# Patient Record
Sex: Male | Born: 1935 | Race: White | Hispanic: No | Smoking: Never smoker
Health system: Southern US, Community
[De-identification: ages and names within clinical notes are randomized; demographics above are authoritative.]

## PROBLEM LIST (undated history)

## (undated) DIAGNOSIS — E860 Dehydration: Secondary | ICD-10-CM

## (undated) DIAGNOSIS — N400 Enlarged prostate without lower urinary tract symptoms: Secondary | ICD-10-CM

## (undated) DIAGNOSIS — K469 Unspecified abdominal hernia without obstruction or gangrene: Secondary | ICD-10-CM

## (undated) DIAGNOSIS — K219 Gastro-esophageal reflux disease without esophagitis: Secondary | ICD-10-CM

## (undated) DIAGNOSIS — I2510 Atherosclerotic heart disease of native coronary artery without angina pectoris: Secondary | ICD-10-CM

## (undated) DIAGNOSIS — G4731 Primary central sleep apnea: Secondary | ICD-10-CM

## (undated) DIAGNOSIS — I82409 Acute embolism and thrombosis of unspecified deep veins of unspecified lower extremity: Secondary | ICD-10-CM

## (undated) DIAGNOSIS — R45851 Suicidal ideations: Secondary | ICD-10-CM

## (undated) DIAGNOSIS — I2699 Other pulmonary embolism without acute cor pulmonale: Secondary | ICD-10-CM

## (undated) DIAGNOSIS — D6851 Activated protein C resistance: Secondary | ICD-10-CM

## (undated) HISTORY — DX: Activated protein C resistance: D68.51

## (undated) HISTORY — DX: Dehydration: E86.0

## (undated) HISTORY — DX: Benign prostatic hyperplasia without lower urinary tract symptoms: N40.0

## (undated) HISTORY — DX: Other pulmonary embolism without acute cor pulmonale: I26.99

## (undated) HISTORY — DX: Primary central sleep apnea: G47.31

## (undated) HISTORY — PX: HERNIA REPAIR: SHX51

## (undated) HISTORY — PX: CORONARY ARTERY BYPASS GRAFT: SHX141

## (undated) HISTORY — DX: Unspecified abdominal hernia without obstruction or gangrene: K46.9

## (undated) HISTORY — DX: Acute embolism and thrombosis of unspecified deep veins of unspecified lower extremity: I82.409

## (undated) HISTORY — PX: CATARACT EXTRACTION: SUR2

## (undated) HISTORY — DX: Suicidal ideations: R45.851

## (undated) HISTORY — DX: Atherosclerotic heart disease of native coronary artery without angina pectoris: I25.10

## (undated) HISTORY — DX: Gastro-esophageal reflux disease without esophagitis: K21.9

---

## 2004-05-28 ENCOUNTER — Other Ambulatory Visit: Payer: Self-pay

## 2005-04-17 DIAGNOSIS — I25810 Atherosclerosis of coronary artery bypass graft(s) without angina pectoris: Secondary | ICD-10-CM | POA: Insufficient documentation

## 2005-04-29 ENCOUNTER — Other Ambulatory Visit: Payer: Self-pay

## 2005-04-29 ENCOUNTER — Inpatient Hospital Stay: Payer: Medicare Other | Admitting: Cardiovascular Disease

## 2005-04-30 ENCOUNTER — Other Ambulatory Visit: Payer: Self-pay

## 2005-05-01 ENCOUNTER — Other Ambulatory Visit: Payer: Self-pay

## 2005-05-02 ENCOUNTER — Other Ambulatory Visit: Payer: Self-pay

## 2005-05-16 ENCOUNTER — Ambulatory Visit: Payer: Self-pay | Admitting: Internal Medicine

## 2005-07-04 ENCOUNTER — Encounter: Payer: Medicare Other | Admitting: Cardiovascular Disease

## 2005-07-18 ENCOUNTER — Encounter: Payer: Medicare Other | Admitting: Cardiovascular Disease

## 2005-08-18 ENCOUNTER — Encounter: Payer: Medicare Other | Admitting: Cardiovascular Disease

## 2005-09-17 ENCOUNTER — Encounter: Payer: Medicare Other | Admitting: Cardiovascular Disease

## 2007-05-01 ENCOUNTER — Ambulatory Visit: Payer: Self-pay | Admitting: Internal Medicine

## 2007-06-04 ENCOUNTER — Ambulatory Visit: Payer: Self-pay

## 2007-06-04 ENCOUNTER — Encounter: Payer: Self-pay | Admitting: Internal Medicine

## 2007-06-17 ENCOUNTER — Ambulatory Visit: Payer: Self-pay | Admitting: Internal Medicine

## 2007-08-28 ENCOUNTER — Ambulatory Visit: Payer: Self-pay | Admitting: Internal Medicine

## 2007-11-19 ENCOUNTER — Ambulatory Visit: Payer: Self-pay | Admitting: Internal Medicine

## 2007-11-19 LAB — CONVERTED CEMR LAB
AST: 24 units/L (ref 0–37)
Albumin: 4.2 g/dL (ref 3.5–5.2)
Alkaline Phosphatase: 92 units/L (ref 39–117)
Bilirubin, Direct: 0.1 mg/dL (ref 0.0–0.3)
Indirect Bilirubin: 0.5 mg/dL (ref 0.0–0.9)
LDL Cholesterol: 133 mg/dL — ABNORMAL HIGH (ref 0–99)
Total Bilirubin: 0.6 mg/dL (ref 0.3–1.2)

## 2007-11-22 ENCOUNTER — Ambulatory Visit: Payer: Self-pay | Admitting: Internal Medicine

## 2008-03-20 ENCOUNTER — Ambulatory Visit: Payer: Self-pay | Admitting: Internal Medicine

## 2008-03-26 ENCOUNTER — Ambulatory Visit: Payer: Self-pay | Admitting: Internal Medicine

## 2008-03-26 LAB — CONVERTED CEMR LAB
ALT: 22 units/L (ref 0–53)
BUN: 29 mg/dL — ABNORMAL HIGH (ref 6–23)
CO2: 25 meq/L (ref 19–32)
Calcium: 8.9 mg/dL (ref 8.4–10.5)
Chloride: 106 meq/L (ref 96–112)
Cholesterol: 153 mg/dL (ref 0–200)
Creatinine, Ser: 1.59 mg/dL — ABNORMAL HIGH (ref 0.40–1.50)
Glucose, Bld: 76 mg/dL (ref 70–99)
Total CHOL/HDL Ratio: 3.4
Triglycerides: 116 mg/dL (ref <150)

## 2008-03-31 ENCOUNTER — Encounter: Payer: Self-pay | Admitting: Internal Medicine

## 2008-03-31 LAB — CONVERTED CEMR LAB
Eosinophils Absolute: 0.5 10*3/uL (ref 0.0–0.7)
Eosinophils Relative: 8 % — ABNORMAL HIGH (ref 0–5)
HCT: 45.9 % (ref 39.0–52.0)
Lymphs Abs: 2.2 10*3/uL (ref 0.7–4.0)
MCHC: 32.7 g/dL (ref 30.0–36.0)
MCV: 96 fL (ref 78.0–100.0)
Monocytes Relative: 8 % (ref 3–12)
Platelets: 233 10*3/uL (ref 150–400)
RBC: 4.78 M/uL (ref 4.22–5.81)
WBC: 7 10*3/uL (ref 4.0–10.5)

## 2008-07-17 ENCOUNTER — Ambulatory Visit: Payer: Self-pay | Admitting: Internal Medicine

## 2008-07-27 ENCOUNTER — Ambulatory Visit: Payer: Self-pay | Admitting: Cardiology

## 2008-07-27 LAB — CONVERTED CEMR LAB
Alkaline Phosphatase: 79 units/L (ref 39–117)
BUN: 27 mg/dL — ABNORMAL HIGH (ref 6–23)
Glucose, Bld: 100 mg/dL — ABNORMAL HIGH (ref 70–99)
HDL: 50 mg/dL (ref >39)
LDL Cholesterol: 78 mg/dL (ref 0–99)
Total Bilirubin: 0.6 mg/dL (ref 0.3–1.2)
Total CHOL/HDL Ratio: 2.9
Triglycerides: 87 mg/dL (ref <150)
VLDL: 17 mg/dL (ref 0–40)

## 2008-10-14 ENCOUNTER — Ambulatory Visit: Payer: Self-pay | Admitting: Internal Medicine

## 2008-10-28 ENCOUNTER — Encounter: Payer: Self-pay | Admitting: Internal Medicine

## 2008-10-28 ENCOUNTER — Ambulatory Visit: Payer: Self-pay

## 2009-01-20 ENCOUNTER — Encounter: Payer: Self-pay | Admitting: Internal Medicine

## 2009-01-20 ENCOUNTER — Ambulatory Visit: Payer: Self-pay | Admitting: Internal Medicine

## 2009-01-20 DIAGNOSIS — E785 Hyperlipidemia, unspecified: Secondary | ICD-10-CM

## 2009-01-20 DIAGNOSIS — R0989 Other specified symptoms and signs involving the circulatory and respiratory systems: Secondary | ICD-10-CM | POA: Insufficient documentation

## 2009-01-20 DIAGNOSIS — I359 Nonrheumatic aortic valve disorder, unspecified: Secondary | ICD-10-CM | POA: Insufficient documentation

## 2009-01-21 ENCOUNTER — Ambulatory Visit: Payer: Self-pay | Admitting: Cardiology

## 2009-01-21 LAB — CONVERTED CEMR LAB
ALT: 18 units/L (ref 0–53)
Basophils Absolute: 0 10*3/uL (ref 0.0–0.1)
CO2: 24 meq/L (ref 19–32)
Chloride: 107 meq/L (ref 96–112)
Cholesterol: 153 mg/dL (ref 0–200)
Eosinophils Relative: 5 % (ref 0–5)
LDL Cholesterol: 80 mg/dL (ref 0–99)
Lymphocytes Relative: 35 % (ref 12–46)
Neutro Abs: 3.7 10*3/uL (ref 1.7–7.7)
Platelets: 225 10*3/uL (ref 150–400)
RDW: 12.5 % (ref 11.5–15.5)
Sodium: 143 meq/L (ref 135–145)
TSH: 3.941 microintl units/mL (ref 0.350–4.50)
Total Bilirubin: 0.6 mg/dL (ref 0.3–1.2)
Total Protein: 6.9 g/dL (ref 6.0–8.3)
VLDL: 29 mg/dL (ref 0–40)

## 2009-07-23 ENCOUNTER — Ambulatory Visit: Payer: Self-pay | Admitting: Internal Medicine

## 2010-02-25 ENCOUNTER — Ambulatory Visit: Payer: Self-pay | Admitting: Internal Medicine

## 2010-03-24 ENCOUNTER — Encounter: Payer: Self-pay | Admitting: Internal Medicine

## 2010-03-24 ENCOUNTER — Ambulatory Visit: Payer: Self-pay

## 2010-03-31 ENCOUNTER — Encounter: Payer: Self-pay | Admitting: Cardiology

## 2010-09-02 ENCOUNTER — Ambulatory Visit: Payer: Self-pay | Admitting: Internal Medicine

## 2011-01-17 NOTE — Medication Information (Signed)
Summary: Med List  Med List   Imported By: Harlon Flor 03/31/2010 09:17:10  _____________________________________________________________________  External Attachment:    Type:   Image     Comment:   External Document

## 2011-01-17 NOTE — Assessment & Plan Note (Signed)
Summary: F6M/AMD      Allergies Added: NKDA  Visit Type:  Follow-up Primary Provider:  Dareen [name]   History of Present Illness: Travis [name] is a delightful 77 year old male with a history of coronary artery disease status post non-ST-elevation myocardial infarction in May 2006 followed by bypass surgery.  He has a history of central sleep apnea, mild aortic insufficiency, mild mitral insufficiency, and a history of pulmonary embolus in the setting of fall in 2005.  He returns today for routine followup.   ECHO 4/11 EF 50-55% mild to moderate AI, mild MR.   Overall doing fairly well. Feels weak at times. Continues to do all his activities independently. Denies CP, sob or dizziness. No CHF symptoms. No palpitations. Compliant with medications.  Current Medications (verified): 1)  Aspirin Adult Low Strength 81 Mg Tbec (Aspirin) .Marland Kitchen.. 1 By Mouth Daily 2)  Lipitor 80 Mg Tabs (Atorvastatin Calcium) .Marland Kitchen.. 1 By Mouth Daily 3)  Prilosec 20 Mg Cpdr (Omeprazole) .Marland Kitchen.. 1 By Mouth Daily 4)  Docusate Sodium 100 Mg Caps (Docusate Sodium) .Marland Kitchen.. 1 By Mouth Bid 5)  Avodart 0.5 Mg Caps (Dutasteride) .Marland Kitchen.. 1 By Mouth T,th,sat  Allergies (verified): No Known Drug Allergies  Past History:  Past Medical History: Last updated: 07/23/2009  1. Coronary artery disease.       a.   s/p NSTEMI 04/2005.       b.   s/p CABG  with a left internal mammary artery to the left anterior        descending, a saphenous vein graft to the PDA, a saphenous vein        graft to the right coronary, and a saphenous vein graft to the 1st        obtuse marginal.       c.     Echo 11/09: EF 60% mild AI.  2. History of pulmonary embolus in the setting of a fall in 2004 or       2005, treated with 6 months of anticoagulation.   3. Benign prostatic hypertrophy.   4. Gastroesophageal reflux disease.   5. Central sleep apnea, followed by Dr. Zara Council.   6. Hypotension requiring d/c of ACE-I in 4/09  7. h/o suicidal ideation with  SSRIs  Social History: Last updated: 01/20/2009 He is widowed.  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 He denies any tobacco or alcohol.  Marital Status:  Children:  Occupation:   Review of Systems       As per HPI and past medical history; otherwise all systems negative.   Vital Signs:  Patient profile:   77 year old male Height:      70 inches Weight:      148 pounds BMI:     21.31 Pulse rate:   84 / minute BP sitting:   112 / 72  (left arm) Cuff size:   regular  Vitals Entered By: Bishop Dublin, CMA (September 02, 2010 2:02 PM)  Physical Exam  General:  elderly. hard of hearing well appearing. no resp difficulty HEENT: normal Neck: supple. no JVD. Carotids 2+ bilat; not bruits. No lymphadenopathy or thryomegaly appreciated. Cor: PMI nondisplaced. Regular rate & rhythm. No rubs, gallops, 2/6 systolic and 2/6 diastolic murmur at RSB Lungs: clear Abdomen: soft, nontender, nondistended.Peri Jefferson bowel sounds. Extremities: no cyanosis, clubbing, rash, edema Neuro: alert & orientedx3, cranial nerves grossly intact. moves all 4 extremities w/o difficulty.  affect pleasant hard of hearing    Impression & Recommendations:  Problem # 1:  AORTIC VALVE DISORDERS (ICD-424.1) Stable by ECHO . No HF. Continue surveillance.  Problem # 2:  CAD, ARTERY BYPASS GRAFT (ICD-414.04) Stable. No evidence of ischemia. Continue current regimen.

## 2011-01-17 NOTE — Assessment & Plan Note (Signed)
Summary: F6M/AMD      Allergies Added: NKDA  Visit Type:  Follow-up Primary Provider:  Dareen Piano  CC:  no complaints.  History of Present Illness: Mr. Pozzi is a delightful 77 year old male with a history of coronary artery disease status post non-ST-elevation myocardial infarction in May 2006 followed by bypass surgery.  He has a history of central sleep apnea, mild aortic insufficiency, mild mitral insufficiency, and a history of pulmonary embolus in the setting of fall in 2005.  He returns today for routine followup.   Overall doing well. Denies CP, sob or dizziness. No CHF symptoms. Has recently been mildly tachycardic with HR in 90s. CBC and thyroid panel normal. No palpitations or lightheadedness.  Current Medications (verified): 1)  Aspirin Adult Low Strength 81 Mg Tbec (Aspirin) .Marland Kitchen.. 1 By Mouth Daily 2)  Lipitor 80 Mg Tabs (Atorvastatin Calcium) .Marland Kitchen.. 1 By Mouth Daily 3)  Prilosec 20 Mg Cpdr (Omeprazole) .Marland Kitchen.. 1 By Mouth Daily 4)  Docusate Sodium 100 Mg Caps (Docusate Sodium) .Marland Kitchen.. 1 By Mouth Bid 5)  Avodart 0.5 Mg Caps (Dutasteride) .Marland Kitchen.. 1 By Mouth T,th,sat  Allergies (verified): No Known Drug Allergies  Past History:  Past Medical History: Reviewed history from 07/23/2009 and no changes required.  1. Coronary artery disease.       a.   s/p NSTEMI 04/2005.       b.   s/p CABG  with a left internal mammary artery to the left anterior        descending, a saphenous vein graft to the PDA, a saphenous vein        graft to the right coronary, and a saphenous vein graft to the 1st        obtuse marginal.       c.     Echo 11/09: EF 60% mild AI.  2. History of pulmonary embolus in the setting of a fall in 2004 or       2005, treated with 6 months of anticoagulation.   3. Benign prostatic hypertrophy.   4. Gastroesophageal reflux disease.   5. Central sleep apnea, followed by Dr. Zara Council.   6. Hypotension requiring d/c of ACE-I in 4/09  7. h/o suicidal ideation with  SSRIs  Review of Systems       As per HPI and past medical history; otherwise all systems negative.   Vital Signs:  Patient profile:   77 year old male Height:      70 inches Weight:      154.25 pounds Pulse rate:   98 / minute Pulse rhythm:   regular BP sitting:   114 / 70  (left arm) Cuff size:   regular  Vitals Entered By: Mercer Pod (February 25, 2010 3:57 PM)  Physical Exam  General:  Gen: elderly. hard of hearing well appearing. no resp difficulty HEENT: normal Neck: supple. no JVD. Carotids 2+ bilat; not bruits. No lymphadenopathy or thryomegaly appreciated. Cor: PMI nondisplaced. Regular rate & rhythm. No rubs, gallops, 2/6 systolic and 2/6 diastolic murmur at RSB Lungs: clear Abdomen: soft, nontender, nondistended.Peri Jefferson bowel sounds. Extremities: no cyanosis, clubbing, rash, edema Neuro: alert & orientedx3, cranial nerves grossly intact. moves all 4 extremities w/o difficulty. affect pleasant hard of hearing    Impression & Recommendations:  Problem # 1:  CAD, ARTERY BYPASS GRAFT (ICD-414.04) Stable. No evidence of ischemia. Continue current regimen.  Problem # 2:  TACHYCARDIA (ICD-785) W/u to date has been normal. Will repeat echo to make sure EF  stable and evaluate aortic valve disease.  Problem # 3:  AORTIC VALVE DISORDERS (ICD-424.1) As above, will repeat echo.  Other Orders: Echocardiogram (Echo)  Patient Instructions: 1)  Your physician recommends that you schedule a follow-up appointment in: 6 months 2)  Your physician recommends that you continue on your current medications as directed. Please refer to the Current Medication list given to you today. 3)  Your physician has requested that you have an echocardiogram.  Echocardiography is a painless test that uses sound waves to create images of your heart. It provides your doctor with information about the size and shape of your heart and how well your heart's chambers and valves are working.  This  procedure takes approximately one hour. There are no restrictions for this procedure.

## 2011-03-22 ENCOUNTER — Ambulatory Visit: Payer: Medicare Other | Admitting: Ophthalmology

## 2011-04-04 ENCOUNTER — Ambulatory Visit: Payer: Medicare Other | Admitting: Ophthalmology

## 2011-05-02 NOTE — Assessment & Plan Note (Signed)
[facility] OFFICE NOTE   NAME:Travis [name], Travis [name]                          MRN:          [id number]  DATE:03/20/2008                            DOB:          12-Mar-1916    PRIMARY CARE PHYSICIAN:  Dr. Einar Crow.   INTERVAL HISTORY:  Mr. Semper is a delightful 77 year old male with a  history of coronary artery disease, status post non-ST elevation  myocardial infarction in May of 2006, followed by bypass surgery.  He  also has a history of central sleep apnea, mild aortic insufficiency,  mild mitral insufficiency, and a history of pulmonary embolus in the  setting of a fall in 2005.  He returns today for routine followup.  At  his last visit, his blood pressure was a bit low and we cut his  enalapril back.  We also have recently switched him back from lovastatin  to Lipitor as his LDL was not well controlled.   He is here today with his daughter.  Overall he is doing pretty good.  He says he has good days and bad days.  He gets short of breath if he  really exerts himself, but otherwise he does fine.  No chest pain, no  heart failure.  He does have quite a bit of fatigue.   CURRENT MEDICATIONS:  Lipitor 40 a day, enalapril 1.25 a day, aspirin  81, Avodart 0.5, Prilosec 20 a day, Colace 100 b.i.d.   PHYSICAL EXAM:  He is an elderly male in no acute distress.  Ambulates  around the clinic without any respiratory difficulty.  He is somewhat  hard of hearing.  Blood pressure is 82/56 in the left arm and 78/52 in the right arm.  HEENT:  Normal.  NECK:  Supple.  No JVD.  Carotids are 2+ bilaterally without bruits.  There is no lymphadenopathy or thyromegaly.  CARDIAC:  PMI is laterally displaced.  He is regular and mildly  tachycardic.  A very soft apical murmur.  LUNGS:  Clear.  ABDOMEN:  Soft, nontender, nondistended.  There is no  hepatosplenomegaly.  No bruits, no masses.  Good bowel sounds.  EXTREMITIES:   Warm, no cyanosis, clubbing or edema.  No rash.  NEURO:  Alert and oriented x3.  Cranial nerves II-XII are intact.  Moves  all 4 extremities without difficulty.  Affect is pleasant.   EKG shows sinus tachycardia with a rate of 100, with a right bundle  branch block, nonspecific inferior T-wave abnormality.   ASSESSMENT/PLAN:  1. Coronary artery disease.  Mr. Azam is stable.  No evidence of      ischemia.  Continue current medication.  2. Hyperlipidemia.  He is due for repeat lipids.  Goal LDL is less      than 70.  3. Hypotension.  We will stop his enalapril altogether to see how this      does.  4. Aortic insufficiency and this is mild.  It does not appear to be      symptomatic.  Echocardiogram every other year or so.  5. Tachycardia.  His heart rate not much changed from baseline but      when we see him to check his cholesterol next week, we will also      get a CBC to make sure he is not anemic, as well as a thyroid      panel.     Bevelyn Buckles. Bensimhon, MD  Electronically Signed    DRB/MedQ  DD: 03/20/2008  DT: 03/20/2008  Job #: [id number]   cc:   Einar Crow, MD

## 2011-05-02 NOTE — Assessment & Plan Note (Signed)
[facility] OFFICE NOTE   NAME:FITCHCruze, Zingaro                          MRN:          [id number]  DATE:07/17/2008                            DOB:          11-04-1916    PRIMARY CARE PHYSICIAN:  Dr. Einar Crow.   INTERVAL HISTORY:  Mr. Grayer is a delightful 77 year old male with a  history of coronary artery disease status post non-ST-elevation  myocardial infarction in May 2006 followed by bypass surgery.  He also  has a history of central sleep apnea, mild aortic insufficiency, mild  mitral insufficiency, and a history of pulmonary embolus in the setting  of a fall in 2005.  He returns today for a routine followup.   At his last visit, his blood pressure was low with systolics in the 70-  80 range.  We discontinued his enalapril, he feels much better.  We also  recently increased his Lipitor, his LDL was not at goal.   Overall, he says he is doing pretty good.  He does have good days and  bad days.  He has just mild occasional chest pain without any real  change, this is quite mild.  He has not had any severe episodes.  He has  chronic shortness of breath, which is unchanged.   CURRENT MEDICATIONS:  1. Aspirin 81 a day.  2. Omeprazole 20 a day.  3. Colace 100 b.i.d.  4. Lipitor 80 a day.  5. Avodart 0.5 mg Tuesday, Thursday, and Saturday.   PHYSICAL EXAMINATION:  GENERAL:  He is an elderly male in no acute  distress.  Ambulates around the clinic without any respiratory  difficulty.  He is hard of hearing and wears hearing aids.  VITAL SIGNS:  Blood pressure is 104/65, heart rate is 94, and weight is  151.  HEENT:  Normal.  NECK:  Supple.  No JVD.  Carotids are 2+ bilaterally without bruits.  There is no lymphadenopathy or thyromegaly.  CARDIAC:  PMI is regular.  He has regular rate and rhythm.  No obvious  murmurs.  LUNGS:  Clear.  ABDOMEN:  Soft, nontender, and nondistended.  No  hepatosplenomegaly.  No  bruits.  No masses.  Good bowel sounds.  EXTREMITIES:  Warm with no cyanosis, clubbing, or edema.  No rash.  NEURO:  Alert and oriented x3.  Cranial nerves II through XII are  intact.  Moves all 4 extremities without difficulty.  Affect is  pleasant.   EKG shows sinus rhythm with a right bundle-branch block at 94 beats per  minute.  No significant ST-T wave abnormalities.   ASSESSMENT AND PLAN:  1. Coronary artery disease.  Mr. Cybulski is stable.  He does have      occasional transient chest pain.  Continue current therapy.  2. Hyperlipidemia.  We will recheck his lipids and liver panel in 1-2      weeks.  Goal LDL is less than 70.  3. Hypotension, much improved after discontinuing his enalapril.  4. Aortic and mitral insufficiency, this is mild.  We will get an  echocardiogram in a year or so.     Bevelyn Buckles. Bensimhon, MD  Electronically Signed    DRB/MedQ  DD: 07/17/2008  DT: 07/18/2008  Job #: [id number]

## 2011-05-02 NOTE — Assessment & Plan Note (Signed)
[facility] OFFICE NOTE   NAME:FITCHDemitri, Kucinski                          MRN:          [id number]  DATE:05/01/2007                            DOB:          [birth year]    PRIMARY CARE PHYSICIAN:  Dr. Einar Crow at the [facility].   REASON FOR CONSULTATION:  Establish cardiac care in [place].   HISTORY OF PRESENT ILLNESS:  Mr. Vest is a delightful, 77 year old male  with a history of coronary artery disease.  He presented to [facility] in May of 2006 with chest pain and not feeling right.  He was found to have a small non-ST elevation myocardial infarction.  He  was subsequently transferred to [facility] and underwent 4-vessel bypass in  May of 2006 with a LIMA to the LAD, a saphenous vein graft to the PDA, a  saphenous vein graft to the right coronary, and a saphenous vein graft  to the obtuse marginal.  Initial EF was about 45%, but on followup his  EF had normalized.  His echocardiogram is also notable for some mild  aortic insufficiency, mild mitral insufficiency, and mild pulmonic  insufficiency.  He also has a history of pulmonary embolus in the  setting of a fall back in 2004 or 2005 for which he was treated with 6  months of anticoagulation.  Aside from that, his medical history is  quite benign.   Since his heart attack, he is followed closely with Dr. Nolon Rod at  [facility].  In talking to Mr. Steeves over the past month, he has noted that he  feels quite fatigued when he gets out of bed in the morning, and often  does not have the energy to do anything, and he said this has been  associated with some more shortness of breath, but he denies any chest  pain.  In looking back through his previous clinic notes with Dr. Regino Schultze,  it seems like this has been, actually, a more insidious process going on  for over a year.  He has had quite an extensive workup.  Apparently, he  has had a repeat  stress test which was negative.  He also was referred  to Dr. Lenard Simmer in the geriatrics clinic at [facility], had a full battery of  labs, including a normal thyroid panel, normal cortisol, normal  hemoglobin.  He was also sent to Dr. Beverely Risen in neurology at [facility], and  was diagnosed with central sleep apnea, and has apparently been due to  start on a new machine for central sleep apnea.  Unfortunately, it seems  as though his fatigue and morning dyspnea has been persistent.  He  denies any lower extremity edema, no orthopnea, no PND.  He has not had  fevers or chills with this.   After his wife died about 10 years ago, he did suffer from some  significant depression, and was started on Zoloft which created suicidal  ideation, and he has not been back on any antidepressants since.  He  does not feel clinically depressed.  His beta blocker was recently  decreased from twice a day to once a day, and this does not seem to have  helped him very much.   REVIEW OF SYSTEMS:  As per HPI and problem list, otherwise all systems  negative.   PROBLEM LIST:  1. Coronary artery disease.      a.     Status post non-ST elevation myocardial infarction in May of       2006.      b.     Status post coronary artery bypass grafting by Dr. Georgia Dom       at [facility] with a left internal mammary artery to the left anterior       descending, a saphenous vein graft to the PDA, a saphenous vein       graft to the right coronary, and a saphenous vein graft to the 1st       obtuse marginal.      c.     Echocardiogram showed an ejection fraction of 50% with mild       aortic insufficiency, mild mitral insufficiency, and mild pulmonic       insufficiency.  There was grade I diastolic dysfunction.  2. History of pulmonary embolus in the setting of a fall in 2004 or      2005, treated with 6 months of anticoagulation.  3. Benign prostatic hypertrophy.  4. Gastroesophageal reflux disease.  5. Central sleep apnea, followed  by Dr. Zara Council.   CURRENT MEDICATIONS:  1. Colace 100 a day.  2. Lopresor 25 once a day.  3. Prilosec 20 a day.  4. Enalapril 2.5 a day.  5. Lovastatin 40 a day.  6. Requip 0.25 mg at night.  7. Aspirin 325 a day.  8. Flomax 0.4 a day.   ALLERGIES/INTOLERANCES:  ZOLOFT WHICH CAUSED SUICIDAL IDEATION.   SOCIAL HISTORY:  He is widowed.  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  He denies any tobacco or alcohol.   FAMILY HISTORY:  Noncontributory.   PHYSICAL EXAMINATION:  He is an elderly male who appears younger than  his stated age.  He is somewhat hard of hearing.  He ambulates around  the clinic without any respiratory difficulty.  Blood pressure is 104/60 in the right arm taken manually, and in the  left arm difficult to auscultate clearly.  Heart rate is 77, weight is  160.  HEENT:  Normal except for the presence of hearing aids.  NECK:  Supple, no JVD.  Carotids are 2+ bilaterally without any bruits.  There is no lymphadenopathy or thyromegaly.  CARDIAC:  His PMI is normal.  He has a regular rate and rhythm with  normal S1 and S2.  There is an S4 present.  He has a 2/6 holosystolic  murmur at the left sternal border.  I do not hear any mitral  insufficiency.  LUNGS: Clear.  ABDOMEN:  Soft, nontender, nondistended, no hepatosplenomegaly, no  bruits, no masses, good bowel sounds.  EXTREMITIES:  Warm with no cyanosis, clubbing, or edema.  No rash.  Good  distal pulses.  NEURO:  He is alert and oriented x3.  Cranial nerves II-XII are grossly  intact.  Moves all 4 extremities without difficulty.  Affect is very  pleasant.   EKG shows normal sinus rhythm with left atrial enlargement at a rate of  77.  There is also a right bundle  branch block.  No significant ST T-  wave abnormalities.   ASSESSMENT AND PLAN: 1. Coronary artery disease status post previous myocardial infarction.      He is doing quite well  with no obvious signs of ischemia.  Continue      current regimen except for the fact that we will transition his      once a day Lopressor over to Toprol XL 25 mg a day.  2. Hyperlipidemia.  Continue statin.  We will recheck his lipids and      make sure his LDL is at goal.  3. Valvular disease.  Will get a repeat echo and continue to follow      him with yearly echoes.  Clinically, his valvular disease does not      appear to have progressed.  4. Fatigue.  I suspect this is multifactorial, but there may be a      significant component of deconditioning and perhaps elderly      depression.  I have asked him to follow up with Dr. Lenard Simmer      regarding a possible antidepressant.  We have also referred him      back to [facility] to participate in the maintenance program.  We      will check a routine battery of labs to make sure that he is not      anemic and his thyroid function is normal.   I had a long talk with both him and his daughter about these issues.  We  spent nearly an hour going through everything.  I will see him back in 6-  8 weeks to follow up on his condition and his tests.  I have also  suggested baseline Myoview to make sure that there is no subtle ischemia  contributing to his dyspnea on exertion and fatigue.     Bevelyn Buckles. Bensimhon, MD     DRB/MedQ  DD: 05/01/2007  DT: 05/01/2007  Job #: [id number]

## 2011-05-02 NOTE — Assessment & Plan Note (Signed)
[facility] OFFICE NOTE   NAME:Travis [name], Travis [name]                          MRN:          [id number]  DATE:10/14/2008                            DOB:          December 04, 1916    PRIMARY CARE PHYSICIAN:  Dr. Einar Crow   HISTORY:  Travis [name] is a delightful 77 year old male with a history of  coronary artery disease status post non-ST-elevation myocardial  infarction in May 2006 followed by bypass surgery.  He has a history of  central sleep apnea, mild aortic insufficiency, mild mitral  insufficiency, and a history of pulmonary embolus in the setting of fall  in 2005.  He returns today for routine followup.   We previously had to discontinue his enalapril secondary to significant  hypotension with blood pressures in the 70s.  He feels much better.  Otherwise, he is doing pretty well.  He denies any chest pain.  He does  get exertional dyspnea, which is chronic.  No orthopnea.  No PND.  No  lower extremity edema.   CURRENT MEDICATIONS:  1. Aspirin 81 a day.  2. Prilosec 20 a day.  3. Colace 100 b.i.d.  4. Avodart 0.5 on Tuesday, Thursday and Saturday.  5. Lipitor 80 mg a day.   PHYSICAL EXAMINATION:  GENERAL:  He is in no acute distress, ambulatory  around the clinic without any respiratory difficulty.  VITAL SIGNS:  Blood pressure is 108/58, heart rate is 89, weight is 155.  HEENT:  Normal.  NECK:  Supple.  No JVD.  Carotids are 2+ bilaterally without bruits.  There is no lymphadenopathy or thyromegaly.  CARDIAC:  PMI is normal.  He is regular rate and rhythm with a soft  diastolic murmur at the right sternal border.  No rub or gallop.  LUNGS:  Clear.  ABDOMEN:  Soft, nontender, nondistended.  No hepatosplenomegaly.  No  bruits.  No masses.  Good bowel sounds.  EXTREMITIES:  Warm with no cyanosis, clubbing, or edema.  NEURO:  Alert and oriented x3.  Cranial nerves II through XII are intact  except for poor  hearing.  Moves all 4 extremities without difficulty.  Affect is pleasant.   EKG shows sinus rhythm with a right bundle-branch block at a rate 89.  No significant ST-T wave abnormalities.   ASSESSMENT AND PLAN:  1. Coronary artery disease.  He is doing well.  No evidence of      ischemia.  Continue current therapy.  He is unable to tolerate beta-      blockers due to hypotension.  2. Aortic insufficiency, this is mild.  We will check a followup      echocardiogram in the next few      months.  3. Hyperlipidemia.  Goal LDL less than 70.  We will check his lipids      in the next visit.     Travis [name]. Bensimhon, MD  Electronically Signed    DRB/MedQ  DD: 10/14/2008  DT: 10/15/2008  Job #: [id number]

## 2011-05-02 NOTE — Assessment & Plan Note (Signed)
[facility] OFFICE NOTE   NAME:Travis [name], Travis [name]                          MRN:          [id number]  DATE:11/22/2007                            DOB:          August 19, 1916    PRIMARY CARE PHYSICIAN:  Dr. Einar Crow.   INTERVAL HISTORY:  Travis [name] is a delightful 77 year old male with a  history of coronary artery disease status post non-ST elevation  myocardial infarction in May 2006, followed by bypass surgery.  He also  has a history of central sleep apnea, mild aortic insufficiency, mild  mitral insufficiency, and history of pulmonary embolus in the setting of  fall in 2005.  He returns today for routine followup.   At his last visit, we ended up stopping his beta-blocker despite his  history of MI due to severe fatigue.  We also switched his Lipitor over  to lovastatin due to the possibility of a drug rash.  He returns today  with his daughter for routine followup.  He says his energy level is  much better.  He is a little bit tired in the morning but he has been  out raking leaves and doing all kinds of work around the house and this  fatigue is obviously better.  He denies any chest pain or shortness of  breath.  No lower extremity edema.  His main complaint is that he is  continuing to have itching and he would like to get off as many  medications as possible.   CURRENT MEDICATIONS:  1. Colace 100 a day.  2. Prilosec 20 a day.  3. Requip.  4. Enalapril 2.5 mg a day.  5. Lovastatin 40 a day,.  6. Avodart 0.5 a day.  7. Aspirin 81 mg a day.   PHYSICAL EXAMINATION:  GENERAL:  He is an elderly male in no acute  distress.  He ambulates around the clinic without any respiratory  difficulty.  He is somewhat hard of hearing.  VITAL SIGNS:  Blood pressure is 82/58 in the left arm and 90/56 in the  right arm.  HEENT:  Normal.  NECK:  Supple.  There is no JVD.  Carotids are 2 plus bilaterally  without any  bruits.  There is no lymphadenopathy or thyromegaly.  The  rash around his neck has gotten much better since I had seen it before.  There may be still minimal erythema.  CARDIAC:  PMI is laterally displaced.  Irregular rate and rhythm with a  prominent 2/6 diastolic murmur at the right sternal border and a soft  mitral regurgitation murmur at the apex.  There is no gallop.  LUNGS:  Clear.  ABDOMEN:  Soft, nontender, nondistended.  There is no  hepatosplenomegaly.  No bruits.  No masses.  Good bowel sounds.  EXTREMITIES:  Warm with no cyanosis, clubbing, or edema.  No rash.  NEUROLOGIC:  He is alert and oriented x3.  Cranial nerves II-XII are  intact except for his hearing.  He moves all 4 extremities without  difficulty.  Affect is normal.   EKG shows  a normal sinus rhythm with a right bundle branch block and  nonspecific ST-T wave abnormalities.   ASSESSMENT/PLAN:  1. Coronary artery disease, status post bypass surgery.  He is doing      quite well with no evidence of ischemia.  Unfortunately, he is      unable to tolerate beta-blockers due to severe fatigue.  2. Hyperlipidemia.  Most recent cholesterol level was LDL 133 and HDL      50.  We will switch him back from lovastatin to Lipitor 40 and      probably need to go up to 80 as his goal LDL is less than 70.  3. Blood pressure.  Blood pressure is quite low today.  We will      decrease his enalapril back down to 1.25 a day where he was before.  4. Aortic insufficiency.  This is mild.  We will check an      echocardiogram at his next visit.  There is no evidence of heart      failure.     Bevelyn Buckles. Bensimhon, MD  Electronically Signed    DRB/MedQ  DD: 11/22/2007  DT: 11/22/2007  Job #: [id number]

## 2011-05-02 NOTE — Assessment & Plan Note (Signed)
[facility] OFFICE NOTE   NAME:FITCHAntonyo, Travis [name]                          MRN:          [id number]  DATE:08/22/2007                            DOB:          1916-03-05    PRIMARY CARE PHYSICIAN:  Einar Crow, M.D.   INTERVAL HISTORY:  Travis [name] is a delightful 77 year old male with a  history of coronary artery disease status post non-ST-elevation  myocardial infarction in May of 2006 followed by bypass surgery.  He  also has a history of central sleep apnea, mild aortic insufficiency,  mild mitral insufficiency, and history of pulmonary embolism in the  setting of a fall in 2005.  He returns today for routine followup.   Overall, he is feeling quite well.  He still has problems with fatigue  and does not have as much energy as he would like to.  He gets up in the  morning and feels run down, but then he picks up as he goes through the  day and does manage to ride his exercise bike some.  He denies any chest  pain or shortness of breath.  No lower extremity edema, orthopnea, or  PND.   At his last visit, we switched him over from lovastatin to Lipitor to  lower his LDL down to 70.  He has recently developed a maculopapular  rash around his neck.  He has seen a dermatologist, who did not think it  was a drug rash, but was unsure.   CURRENT MEDICATIONS:  1. Colace 100 a day.  2. Omeprazole 20 mg a day.  3. Requip 0.25 mg at night.  4. Aspirin 325.  5. Flomax 0.4 a day.  6. Toprol XL 25.  7. Enalapril 1.25 mg a day.  8. Lipitor 40 a day.   PHYSICAL EXAM:  He is an elderly male who looks younger than his stated  age.  He is very hard of hearing.  He ambulates around the clinic slowly  without any respiratory difficulty.  Blood pressure is 108/66, heart rate 74, weight 156.  HEENT:  Normal.  NECK:  Supple.  No JVD.  Carotids are 2+ bilaterally without any bruits.  There is no lymphadenopathy or  thyromegaly.  He has a maculopapular rash  around his neck.  It is limited to this area.  CARDIAC:  PMI is laterally displaced.  He has a regular rate and rhythm  with a prominent 2/6 diastolic murmur at the right sternal border and a  soft mitral regurgitation murmur at the apex.  LUNGS:  Clear.  ABDOMEN:  Soft, nontender, and nondistended.  There is no  hepatosplenomegaly.  No bruits.  No masses.  Good bowel sounds.  EXTREMITIES:  Warm with no cyanosis, clubbing, or edema.  There is no  rash.  NEURO:  He is alert and oriented x3.  Cranial nerves 2-12 are intact.  Moves all 4 extremities without difficulty.  Affect is flattened.   ASSESSMENT AND PLAN:  1. Coronary artery disease.  This is stable without any evidence of  ongoing ischemia.  Recent Myoview looks good.  2. Aortic insufficiency.  This is stable and mild.  3. Hyperlipidemia.  There is a question of whether or not Lipitor is      causing his rash.  We will stop this and switch him back to      lovastatin at a higher dose at 40 mg a day and recheck his lipids      when he comes back.  4. Fatigue.  This is ongoing.  I suspect it is multifactorial.  We      will go ahead and stop his beta blocker despite his history of      myocardial infarction and see if this makes him symptomatically      improved.  We did suggest cardiac rehab in the past, but I think      this was cost prohibitive for him.  Overall, I think he is doing      okay.   DISPOSITION:  We will see him back in 3 months.     Bevelyn Buckles. Bensimhon, MD  Electronically Signed    DRB/MedQ  DD: 08/22/2007  DT: 08/22/2007  Job #: [id number]   cc:   Cathrine Muster, M.D.

## 2011-05-02 NOTE — Assessment & Plan Note (Signed)
[name] OFFICE NOTE   NAME:FITCHNiccolo, Travis [name]                          MRN:          [id number]  DATE:06/17/2007                            DOB:          10-21-1916    PRIMARY CARE PHYSICIAN:  Dr. Einar Crow.   INTERVAL HISTORY:  Travis Travis [name] is a delightful 77 year old male with a  history of coronary artery disease, status post non-ST elevation  myocardial infarction May 2006 followed by bypass surgery.  He also has  a history of central sleep apnea, mild aortic insufficiency, mild mitral  insufficiency and a history of pulmonary embolism in the setting of fall  in 2005, treated with 6 months of anticoagulation.  He returns today for  routine followup.   Since we last saw him he had an Myoview  which showed an ejection  fraction of  66% with no ischemia.  He continues to complain of severe  fatigue and finds it hard to do many of his activities of daily living.  Denies any chest pain or shortness of breath.  On some days, however, he  says he feels somewhat better than others and on those days he tries to  ride his stationary bike.  He has not had lower extremity edema,  orthopnea or PND.   MEDICATIONS:  1. Colace 100 mg daily.  2. Prilosec 20 daily.  3. Requip 0.25 mg at night.  4. Aspirin 325 daily.  5. Flomax 0.4 daily.  6. Toprol XL 25 daily.  7. Lovastatin 20 daily.  8. Enalapril 2.5 mg 1/2 tablet daily.   ALLERGIES:  To ZOLOFT which causes suicidal tendencies.   ACCESSORY DATA:  BNP was 79, free T4 was normal at 6.5, total  cholesterol was 193, triglycerides 149, HDL 45 and LDL is 118.  Creatinine is 1.5 which is stable.  Liver function tests are normal.  Hemoglobin is 15.5.   PHYSICAL EXAMINATION:  He is an elderly male who looks younger than his  stated age.  Ambulates around the clinic slowly without any respiratory  difficulty.  Blood pressure is 96/60.  Heart rate is 88, weight is 158,  which is down  2 pounds.  HEENT:  Normal.  NECK:  Supple.  There is no JVD.  Carotids are 2+ bilaterally without  any bruits.  There is no lymphadenopathy or thyromegaly.  CARDIAC:  The PMI is mildly laterally displaced.  He has a regular rate  and rhythm.  He has a prominent 2/6 diastolic murmur at the right upper  sternal border and a soft mitral regurgitation murmur at the apex.  LUNGS:  Clear.  ABDOMEN:  Soft, nontender, nondistended.  There is no  hepatosplenomegaly, no bruits, no masses.  Good bowel sounds.  EXTREMITIES:  Warm with no cyanosis, clubbing or edema.  He does have  several ecchymoses from where he bumped himself.  NEURO:  Alert and oriented x3.  Cranial nerves II through XII are  intact.  He moves all 4 extremities without difficulty.  He is hard of  hearing.  Affect is flattened.  ASSESSMENT AND PLAN:  1. Fatigue.  I suspect this is multifactorial but there may be a      component of significant deconditioning.  I recommended Heart      Track, though I think the cost of this may be prohibited.  I have      encouraged him to use his exercise bike more at home whenever      possible.  2. Coronary artery disease.  This is stable without any evidence of      ongoing ischemia.  Myoview looks reassuring.  3. Valvular disease.  This is stable and mild.  4. Hyperlipidemia.  LDL is not at goal.  We will attempt to switch him      over to Lipitor 40 if he can afford the cost and recheck his lipids      in 3 months.     Travis Buckles. Bensimhon, MD  Electronically Signed    DRB/MedQ  DD: 06/17/2007  DT: 06/17/2007  Job #: [id number]   cc:   Einar Crow, MD

## 2011-05-31 ENCOUNTER — Encounter: Payer: Self-pay | Admitting: Internal Medicine

## 2011-06-02 ENCOUNTER — Encounter: Payer: Self-pay | Admitting: Internal Medicine

## 2011-06-06 ENCOUNTER — Ambulatory Visit (INDEPENDENT_AMBULATORY_CARE_PROVIDER_SITE_OTHER): Payer: Self-pay | Admitting: Internal Medicine

## 2011-06-06 ENCOUNTER — Encounter: Payer: Self-pay | Admitting: Internal Medicine

## 2011-06-06 VITALS — BP 122/62 | HR 92 | Ht 70.0 in | Wt 149.1 lb

## 2011-06-06 DIAGNOSIS — I359 Nonrheumatic aortic valve disorder, unspecified: Secondary | ICD-10-CM

## 2011-06-06 DIAGNOSIS — I2510 Atherosclerotic heart disease of native coronary artery without angina pectoris: Secondary | ICD-10-CM

## 2011-06-06 DIAGNOSIS — E785 Hyperlipidemia, unspecified: Secondary | ICD-10-CM

## 2011-06-06 DIAGNOSIS — R230 Cyanosis: Secondary | ICD-10-CM | POA: Insufficient documentation

## 2011-06-06 NOTE — Assessment & Plan Note (Signed)
Suspect due to severe venous insufficiency. Arterial blood flow appears fine. Will check ABIs to confirm. Keep feet elevated. Start with compression stockings. If not improving can refer to vein clinic.

## 2011-06-06 NOTE — Progress Notes (Signed)
HPI:  Travis [name] is a delightful 77 year old male with a history of coronary artery disease status post non-ST-elevation myocardial infarction in May 2006 followed by bypass surgery.  He has a history of central sleep apnea, mild aortic insufficiency, mild mitral insufficiency, and a history of pulmonary embolus in the setting of fall in 2005.  He returns today for routine followup.  ECHO 4/11 EF 50-55% mild to moderate AI, mild MR.   Overall doing fairly well. Feels weak at times. Continues to do all his activities independently. Denies CP, dizziness. Daughter notes increased edema in feet particularly on R.  Gets winded if he tries to do too much. No palpitations. Compliant with medications.    ROS: All systems negative except as listed in HPI, PMH and Problem List.  Past Medical History  Diagnosis Date  . CAD (coronary artery disease)     S/P NSTEMI 04/2005; CABG; Echo 11/09: EF 60% mild AI  . Pulmonary embolism 2004/2005    Hx of, in the setting of a fall in 2004 or 2005, treated with 6 months of anticoagulation  . BPH (benign prostatic hypertrophy)   . GERD (gastroesophageal reflux disease)   . Central sleep apnea     Followed by Dr. Zara Council  . Hypotension     Requiring d/c of ACE-I in 4/09  . Suicidal ideation     h/o with SSRI's  . Hernia     Current Outpatient Prescriptions  Medication Sig Dispense Refill  . aspirin 81 MG EC tablet Take 81 mg by mouth daily.        Marland Kitchen atorvastatin (LIPITOR) 80 MG tablet Take 80 mg by mouth daily.        . cetirizine (ZYRTEC) 10 MG tablet Take 10 mg by mouth daily.        Marland Kitchen docusate sodium (COLACE) 100 MG capsule Take 100 mg by mouth 2 (two) times daily.        Marland Kitchen dutasteride (AVODART) 0.5 MG capsule Take 0.5 mg by mouth. Take on Tuesday, Thursday, and Saturday.       . hydrOXYzine (ATARAX) 10 MG tablet Take 10 mg by mouth 3 (three) times daily as needed.        Marland Kitchen omeprazole (PRILOSEC) 20 MG capsule Take 20 mg by mouth daily.            PHYSICAL EXAM: Filed Vitals:   06/06/11 1133  BP: 122/62  Pulse: 92   General:  elderly. hard of hearing well appearing. no resp difficulty HEENT: normal Neck: supple. no JVD. Carotids 2+ bilat; not bruits. No lymphadenopathy or thryomegaly appreciated. Cor: PMI nondisplaced. Regular rate & rhythm. No rubs, gallops, 2/6 systolic and 2/6 diastolic murmur at RSB Lungs: clear Abdomen: soft, nontender, nondistended.Peri [place] bowel sounds. Extremities: no cyanosis, clubbing, rash. Mild edema. Severe varicose veins R > L. Severe cyanosis. DP 1+ R and 2+ L Neuro: alert & orientedx3, cranial nerves grossly intact. moves all 4 extremities w/o difficulty. affect pleasant hard of hearing    ECG: NSR 92 RBBB Non-specific ST-T wave abnormalities.      ASSESSMENT & PLAN:

## 2011-06-06 NOTE — Assessment & Plan Note (Signed)
Goal LDL < 70.  Continue statin. Recheck lipids and liver. May need to cue atorva dose down given his age.

## 2011-06-06 NOTE — Assessment & Plan Note (Signed)
Stable. Can follow with intermittent echoes.

## 2011-06-06 NOTE — Patient Instructions (Signed)
Your physician has requested that you have an ankle brachial index (ABI). During this test an ultrasound and blood pressure cuff are used to evaluate the arteries that supply the arms and legs with blood. Allow thirty minutes for this exam. There are no restrictions or special instructions.  Your physician recommends you start wearing [facility].  Your physician recommends that you return for lab work in: Same day as ABI's (CBC, CMET, Lipid, TSH)  Your physician recommends that you schedule a follow-up appointment in: 2 months

## 2011-06-06 NOTE — Assessment & Plan Note (Signed)
No evidence of ischemia. Continue current regimen.   

## 2011-07-04 ENCOUNTER — Encounter: Payer: Medicare Other | Admitting: *Deleted

## 2011-07-04 ENCOUNTER — Encounter (INDEPENDENT_AMBULATORY_CARE_PROVIDER_SITE_OTHER): Payer: Medicare Other | Admitting: *Deleted

## 2011-07-04 ENCOUNTER — Other Ambulatory Visit: Payer: Self-pay | Admitting: Internal Medicine

## 2011-07-04 ENCOUNTER — Other Ambulatory Visit (INDEPENDENT_AMBULATORY_CARE_PROVIDER_SITE_OTHER): Payer: Medicare Other | Admitting: *Deleted

## 2011-07-04 DIAGNOSIS — I25810 Atherosclerosis of coronary artery bypass graft(s) without angina pectoris: Secondary | ICD-10-CM

## 2011-07-04 DIAGNOSIS — E785 Hyperlipidemia, unspecified: Secondary | ICD-10-CM

## 2011-07-04 DIAGNOSIS — R230 Cyanosis: Secondary | ICD-10-CM

## 2011-07-04 DIAGNOSIS — I2510 Atherosclerotic heart disease of native coronary artery without angina pectoris: Secondary | ICD-10-CM

## 2011-07-04 DIAGNOSIS — I739 Peripheral vascular disease, unspecified: Secondary | ICD-10-CM

## 2011-07-04 LAB — CBC WITH DIFFERENTIAL/PLATELET
Basophils Absolute: 0 10*3/uL (ref 0.0–0.1)
Eosinophils Absolute: 0.2 10*3/uL (ref 0.0–0.7)
Eosinophils Relative: 4 % (ref 0–5)
MCH: 31 pg (ref 26.0–34.0)
MCV: 96.5 fL (ref 78.0–100.0)
Platelets: 173 10*3/uL (ref 150–400)
RDW: 13.6 % (ref 11.5–15.5)
WBC: 6.3 10*3/uL (ref 4.0–10.5)

## 2011-07-05 ENCOUNTER — Encounter: Payer: Self-pay | Admitting: Internal Medicine

## 2011-07-05 LAB — LIPID PANEL
Cholesterol: 146 mg/dL (ref 0–200)
Total CHOL/HDL Ratio: 3.3 Ratio
Triglycerides: 77 mg/dL (ref <150)
VLDL: 15 mg/dL (ref 0–40)

## 2011-07-05 LAB — COMPREHENSIVE METABOLIC PANEL
ALT: 14 U/L (ref 0–53)
CO2: 24 mEq/L (ref 19–32)
Creat: 1.87 mg/dL — ABNORMAL HIGH (ref 0.50–1.35)
Total Bilirubin: 0.4 mg/dL (ref 0.3–1.2)

## 2011-07-19 ENCOUNTER — Telehealth: Payer: Self-pay

## 2011-07-19 MED ORDER — OMEPRAZOLE 20 MG PO CPDR: 20.0000 mg | DELAYED_RELEASE_CAPSULE | Freq: Every day | ORAL | Status: DC

## 2011-07-19 NOTE — Telephone Encounter (Signed)
Needs a refill for omeprazole sent to [facility].

## 2011-07-31 ENCOUNTER — Ambulatory Visit (INDEPENDENT_AMBULATORY_CARE_PROVIDER_SITE_OTHER): Payer: Medicare Other | Admitting: Cardiovascular Disease

## 2011-07-31 ENCOUNTER — Encounter: Payer: Self-pay | Admitting: Cardiovascular Disease

## 2011-07-31 VITALS — BP 114/75 | HR 100 | Ht 70.0 in | Wt 141.0 lb

## 2011-07-31 DIAGNOSIS — I25810 Atherosclerosis of coronary artery bypass graft(s) without angina pectoris: Secondary | ICD-10-CM

## 2011-07-31 DIAGNOSIS — I2510 Atherosclerotic heart disease of native coronary artery without angina pectoris: Secondary | ICD-10-CM

## 2011-07-31 DIAGNOSIS — I359 Nonrheumatic aortic valve disorder, unspecified: Secondary | ICD-10-CM

## 2011-07-31 DIAGNOSIS — R0989 Other specified symptoms and signs involving the circulatory and respiratory systems: Secondary | ICD-10-CM

## 2011-07-31 DIAGNOSIS — E785 Hyperlipidemia, unspecified: Secondary | ICD-10-CM

## 2011-07-31 MED ORDER — CARVEDILOL 3.125 MG PO TABS: 3.1250 mg | ORAL_TABLET | Freq: Two times a day (BID) | ORAL | Status: DC

## 2011-07-31 NOTE — Assessment & Plan Note (Signed)
Currently with no symptoms of angina. No further workup at this time. Continue current medication regimen. 

## 2011-07-31 NOTE — Assessment & Plan Note (Signed)
No signs of fluid overload. Heart rate is elevated which could be secondary to underlying aortic valve disease.

## 2011-07-31 NOTE — Assessment & Plan Note (Signed)
Will start low dose beta blocker, cord 3.125 b.i.d. And titrate upward slowly as tolerated.

## 2011-07-31 NOTE — Patient Instructions (Signed)
You are doing well. Please start coreg one in the Am and PM Please call us if you have new issues that need to be addressed before your next appt.  We will call you for a follow up Appt. In 4 months

## 2011-07-31 NOTE — Assessment & Plan Note (Signed)
Cholesterol is at goal on the current lipid regimen. No changes to the medications were made.  

## 2011-07-31 NOTE — Progress Notes (Signed)
Patient ID: Travis [name], male    DOB: 1916-08-06, 77 y.o.   MRN: [id number]  HPI Comments: Travis [name] is a  77 year old male with a history of coronary artery disease, status post non-ST-elevation myocardial infarction in May 2006, CABG,  followed by bypass surgery, history of central sleep apnea, mild to moderate aortic insufficiency, mild mitral insufficiency, and a history of pulmonary embolus in the setting of fall in 2005, who returns today for routine followup.    He is hard of hearing and a poor historian. Most of the details come from his daughter. She reports that he has been doing well overall. His legs are weak though he does ambulate and do his ADLs. He lives by himself. He has no significant complaints.  Feels weak at times.  Denies CP, sob or dizziness. No CHF symptoms. No palpitations. Compliant with medications.   ECHO 4/11 EF 50-55% mild to moderate AI, mild MR.    EKG shows normal sinus rhythm with rate 94 beats per minute with right bundle branch block         Outpatient Encounter Prescriptions as of 07/31/2011  Medication Sig Dispense Refill  . aspirin 81 MG EC tablet Take 81 mg by mouth daily.        Marland Kitchen atorvastatin (LIPITOR) 80 MG tablet Take 80 mg by mouth daily.        . cetirizine (ZYRTEC) 10 MG tablet Take 10 mg by mouth daily.        Marland Kitchen docusate sodium (COLACE) 100 MG capsule Take 100 mg by mouth 2 (two) times daily.        Marland Kitchen dutasteride (AVODART) 0.5 MG capsule Take 0.5 mg by mouth. Take on Tuesday, Thursday, and Saturday.       . hydrOXYzine (ATARAX) 10 MG tablet Take 10 mg by mouth 3 (three) times daily as needed.        Marland Kitchen omeprazole (PRILOSEC) 20 MG capsule Take 1 capsule (20 mg total) by mouth daily.  30 capsule  6    Review of Systems  Constitutional: Negative.   HENT: Positive for hearing loss.   Eyes: Negative.   Respiratory: Negative.   Cardiovascular: Negative.   Gastrointestinal: Negative.   Musculoskeletal: Positive for gait problem.  Skin:  Negative.   Neurological: Negative.   Hematological: Negative.   Psychiatric/Behavioral: Negative.   All other systems reviewed and are negative.    BP 114/75  Pulse 100  Ht 5\' 10"  (1.778 m)  Wt 141 lb (63.957 kg)  BMI 20.23 kg/m2   Physical Exam  Nursing note and vitals reviewed. Constitutional: He is oriented to person, place, and time. He appears well-developed and well-nourished.  HENT:  Head: Normocephalic.  Nose: Nose normal.  Mouth/Throat: Oropharynx is clear and moist.  Eyes: Conjunctivae are normal. Pupils are equal, round, and reactive to light.  Neck: Normal range of motion. Neck supple. No JVD present.  Cardiovascular: Normal rate, regular rhythm, S1 normal, S2 normal, normal heart sounds and intact distal pulses.  Exam reveals no gallop and no friction rub.   No murmur heard. Pulmonary/Chest: Effort normal and breath sounds normal. No respiratory distress. He has no wheezes. He has no rales. He exhibits no tenderness.  Abdominal: Soft. Bowel sounds are normal. He exhibits no distension. There is no tenderness.  Musculoskeletal: Normal range of motion. He exhibits no edema and no tenderness.  Lymphadenopathy:    He has no cervical adenopathy.  Neurological: He is alert and oriented to person,  place, and time. Coordination normal.  Skin: Skin is warm and dry. No rash noted. No erythema.  Psychiatric: He has a normal mood and affect. His behavior is normal. Judgment and thought content normal.           Assessment and Plan

## 2011-09-27 ENCOUNTER — Other Ambulatory Visit: Payer: Self-pay | Admitting: *Deleted

## 2011-09-27 MED ORDER — ATORVASTATIN CALCIUM 80 MG PO TABS: 80.0000 mg | ORAL_TABLET | Freq: Every day | ORAL | Status: DC

## 2011-11-21 ENCOUNTER — Encounter: Payer: Self-pay | Admitting: Cardiovascular Disease

## 2011-11-27 ENCOUNTER — Ambulatory Visit (INDEPENDENT_AMBULATORY_CARE_PROVIDER_SITE_OTHER): Payer: Medicare Other | Admitting: Cardiovascular Disease

## 2011-11-27 ENCOUNTER — Encounter: Payer: Self-pay | Admitting: Cardiovascular Disease

## 2011-11-27 DIAGNOSIS — E782 Mixed hyperlipidemia: Secondary | ICD-10-CM

## 2011-11-27 DIAGNOSIS — E785 Hyperlipidemia, unspecified: Secondary | ICD-10-CM

## 2011-11-27 DIAGNOSIS — I25810 Atherosclerosis of coronary artery bypass graft(s) without angina pectoris: Secondary | ICD-10-CM

## 2011-11-27 DIAGNOSIS — I359 Nonrheumatic aortic valve disorder, unspecified: Secondary | ICD-10-CM

## 2011-11-27 DIAGNOSIS — R0989 Other specified symptoms and signs involving the circulatory and respiratory systems: Secondary | ICD-10-CM

## 2011-11-27 DIAGNOSIS — I2510 Atherosclerotic heart disease of native coronary artery without angina pectoris: Secondary | ICD-10-CM

## 2011-11-27 NOTE — Assessment & Plan Note (Signed)
Cholesterol is at goal on the current lipid regimen. No changes to the medications were made.  

## 2011-11-27 NOTE — Assessment & Plan Note (Signed)
Mild-to-moderate aortic valve regurgitation. Likely not contributing to  symptoms at this time.

## 2011-11-27 NOTE — Assessment & Plan Note (Signed)
Currently with no symptoms of angina. No further workup at this time. Continue current medication regimen. 

## 2011-11-27 NOTE — Patient Instructions (Addendum)
You are doing well. No medication changes were made. Please try to drink more fluids. Walk every day.  Please call us if you have new issues that need to be addressed before your next appt.  The office will contact you for a follow up Appt. In 6 months  The phone for [facility] primary care is [phone]

## 2011-11-27 NOTE — Progress Notes (Signed)
Patient ID: Travis [name], male    DOB: 12/10/1916, 77 y.o.   MRN: [id number]  HPI Comments: Travis [name] is a  77 year old male with a history of coronary artery disease, status post non-ST-elevation myocardial infarction in May 2006, CABG, history of sleep apnea, mild to moderate aortic insufficiency,  and a history of pulmonary embolus in the setting of fall in 2005, who returns today for routine followup.  His weight has decreased 13 pounds in the past year.  Does not drink much fluids And has underlying renal insufficiency.  Travis [name] is hard of hearing and a poor historian. Most of the details come from his daughter. She reports that Travis [name] has been doing well overall. His legs are weak though Travis [name] does ambulate and do his ADLs. Travis [name] lives by himself. Travis [name] has no significant complaints.  Feels weak at times.  Denies CP, sob or dizziness. No CHF symptoms. No palpitations. Compliant with medications. Visit is essentially unchanged from his previous visit in 2011. Travis [name] denies any falls.  ECHO 4/11 EF 50-55% mild to moderate AI, mild MR.    EKG shows normal sinus rhythm with rate 90 beats per minute with right bundle branch block         Outpatient Encounter Prescriptions as of 11/27/2011  Medication Sig Dispense Refill  . aspirin 81 MG EC tablet Take 81 mg by mouth daily.        Marland Kitchen atorvastatin (LIPITOR) 80 MG tablet Take 80 mg by mouth daily. Taking 1/2 tablet       . carvedilol (COREG) 3.125 MG tablet Take 1 tablet (3.125 mg total) by mouth 2 (two) times daily.  60 tablet  11  . cetirizine (ZYRTEC) 10 MG tablet Take 10 mg by mouth daily.        Marland Kitchen docusate sodium (COLACE) 100 MG capsule Take 100 mg by mouth 2 (two) times daily.        Marland Kitchen dutasteride (AVODART) 0.5 MG capsule Take 0.5 mg by mouth. Take on Tuesday, Thursday, and Saturday.       . hydrOXYzine (ATARAX) 10 MG tablet Take 10 mg by mouth 3 (three) times daily as needed.        Marland Kitchen omeprazole (PRILOSEC) 20 MG capsule Take 1 capsule (20 mg total) by  mouth daily.  30 capsule  6    Review of Systems  Constitutional: Negative.   HENT: Positive for hearing loss.   Eyes: Negative.   Respiratory: Negative.   Cardiovascular: Negative.   Gastrointestinal: Negative.   Musculoskeletal: Positive for gait problem.  Skin: Negative.   Neurological: Negative.   Hematological: Negative.   Psychiatric/Behavioral: Negative.   All other systems reviewed and are negative.    BP 102/62  Pulse 90  Ht 5\' 10"  (1.778 m)  Wt 141 lb (63.957 kg)  BMI 20.23 kg/m2   Physical Exam  Nursing note and vitals reviewed. Constitutional: Travis [name] is oriented to person, place, and time. Travis [name] appears well-developed and well-nourished.  HENT:  Head: Normocephalic.  Nose: Nose normal.  Mouth/Throat: Oropharynx is clear and moist.  Eyes: Conjunctivae are normal. Pupils are equal, round, and reactive to light.  Neck: Normal range of motion. Neck supple. No JVD present.  Cardiovascular: Normal rate, regular rhythm, S1 normal, S2 normal and intact distal pulses.  Exam reveals no gallop and no friction rub.   Murmur heard.  Crescendo systolic murmur is present with a grade of 2/6  Pulmonary/Chest: Effort normal and breath sounds normal. No respiratory distress.  Travis [name] has no wheezes. Travis [name] has no rales. Travis [name] exhibits no tenderness.  Abdominal: Soft. Bowel sounds are normal. Travis [name] exhibits no distension. There is no tenderness.  Musculoskeletal: Normal range of motion. Travis [name] exhibits no edema and no tenderness.  Lymphadenopathy:    Travis [name] has no cervical adenopathy.  Neurological: Travis [name] is alert and oriented to person, place, and time. Coordination normal.  Skin: Skin is warm and dry. No rash noted. No erythema.  Psychiatric: Travis [name] has a normal mood and affect. His behavior is normal. Judgment and thought content normal.           Assessment and Plan

## 2011-11-27 NOTE — Assessment & Plan Note (Signed)
Heart rate continues to be mildly elevated today. Unable to advance his beta blocker secondary to hypotension. Hypertension probably coming from a drop in his body weight.

## 2011-12-11 ENCOUNTER — Inpatient Hospital Stay: Payer: Medicare Other | Admitting: Internal Medicine

## 2011-12-11 ENCOUNTER — Telehealth: Payer: Self-pay | Admitting: *Deleted

## 2011-12-11 NOTE — Telephone Encounter (Signed)
Pt's daughter called stating he has had swelling in left leg only from knee down, that is warm to touch and painful. Per pt, this has been going on for about 1 week. No swelling in right. Pt does have h/o PE, not on thinners, pt is 89. Advised that pt go to ER for DVT eval and workup, pt's daughter will notify pt. Dr. Elease Hashimoto in office today, made aware.   Pt was seen in office with Dr. Mariah Milling 11/27/11, for 4 mo f/u. H/O CAD, s/p non-STEMI 04/2004, CABG, PE fall 2005. Recent echo 4/11 EF 50-55%, milt-moderate AI, mild MR. EKG 11/27/11 NSR RBBB HR 90.

## 2011-12-18 ENCOUNTER — Telehealth: Payer: Self-pay | Admitting: *Deleted

## 2011-12-18 DIAGNOSIS — I82409 Acute embolism and thrombosis of unspecified deep veins of unspecified lower extremity: Secondary | ICD-10-CM

## 2011-12-18 NOTE — Telephone Encounter (Signed)
[facility] Rn called to notify pt's PT/INR 24.2, 2.3. He was started on Coumadin in hospital 12/25 is taking 5mg  daily. No problems while taking. Advised he continue 5mg  daily, they will recheck this Thursday. Pt has f/u with Dr. Mariah Milling 12/29/11.   Cicero Duck, this pt is going to have INR checks through home health. Do they need an initial visit with [place] or do we just get their report and dose from the beginning? Thanks.

## 2011-12-18 NOTE — Telephone Encounter (Signed)
Pt does not need initial in office visit until discharged by home health.  Will monitor per home health while in pt's home, then follow in clinic once discharged.

## 2011-12-21 ENCOUNTER — Ambulatory Visit (INDEPENDENT_AMBULATORY_CARE_PROVIDER_SITE_OTHER): Payer: Self-pay | Admitting: Cardiology

## 2011-12-21 ENCOUNTER — Ambulatory Visit (INDEPENDENT_AMBULATORY_CARE_PROVIDER_SITE_OTHER): Payer: Medicare Other | Admitting: *Deleted

## 2011-12-21 DIAGNOSIS — I2510 Atherosclerotic heart disease of native coronary artery without angina pectoris: Secondary | ICD-10-CM

## 2011-12-21 DIAGNOSIS — R0989 Other specified symptoms and signs involving the circulatory and respiratory systems: Secondary | ICD-10-CM

## 2011-12-21 DIAGNOSIS — I82409 Acute embolism and thrombosis of unspecified deep veins of unspecified lower extremity: Secondary | ICD-10-CM

## 2011-12-21 LAB — BASIC METABOLIC PANEL
Calcium: 8.8 mg/dL (ref 8.4–10.5)
GFR: 41.58 mL/min — ABNORMAL LOW (ref >60.00)
Glucose, Bld: 105 mg/dL — ABNORMAL HIGH (ref 70–99)
Potassium: 4.3 mEq/L (ref 3.5–5.1)
Sodium: 139 mEq/L (ref 135–145)

## 2011-12-22 ENCOUNTER — Telehealth: Payer: Self-pay | Admitting: *Deleted

## 2011-12-22 MED ORDER — DABIGATRAN ETEXILATE MESYLATE 75 MG PO CAPS
75.0000 mg | ORAL_CAPSULE | Freq: Two times a day (BID) | ORAL | Status: DC
Start: 2011-12-22 — End: 2012-07-18

## 2011-12-22 NOTE — Telephone Encounter (Signed)
Pt's daughter calling for update on med change since coumadin held. I verified with coumadin clinic that we are going to change him to Pradaxa 75mg  BID with his recent creat at 1.7. Pt's HHRN has already been notified to hold coumadin and not start the Pradaxa until INR rechecked and <2.0. Daughter made aware of this as well. She will have someone come by our office to pick up samples of Pradaxa 75mg  and they know to hold this until RN notified to start. The Rx has already been sent to his pharmacy.

## 2011-12-25 ENCOUNTER — Ambulatory Visit (INDEPENDENT_AMBULATORY_CARE_PROVIDER_SITE_OTHER): Payer: Self-pay | Admitting: Cardiovascular Disease

## 2011-12-25 ENCOUNTER — Telehealth: Payer: Self-pay | Admitting: *Deleted

## 2011-12-25 DIAGNOSIS — R0989 Other specified symptoms and signs involving the circulatory and respiratory systems: Secondary | ICD-10-CM

## 2011-12-25 DIAGNOSIS — I82409 Acute embolism and thrombosis of unspecified deep veins of unspecified lower extremity: Secondary | ICD-10-CM

## 2011-12-25 LAB — POCT INR: INR: 1.4

## 2011-12-25 NOTE — Telephone Encounter (Signed)
Opened in error

## 2011-12-29 ENCOUNTER — Ambulatory Visit (INDEPENDENT_AMBULATORY_CARE_PROVIDER_SITE_OTHER): Payer: Medicare Other | Admitting: Cardiovascular Disease

## 2011-12-29 ENCOUNTER — Encounter: Payer: Self-pay | Admitting: Cardiovascular Disease

## 2011-12-29 VITALS — BP 110/62 | HR 74 | Ht 68.0 in | Wt 144.1 lb

## 2011-12-29 DIAGNOSIS — I82409 Acute embolism and thrombosis of unspecified deep veins of unspecified lower extremity: Secondary | ICD-10-CM

## 2011-12-29 DIAGNOSIS — I75029 Atheroembolism of unspecified lower extremity: Secondary | ICD-10-CM | POA: Insufficient documentation

## 2011-12-29 DIAGNOSIS — I2510 Atherosclerotic heart disease of native coronary artery without angina pectoris: Secondary | ICD-10-CM

## 2011-12-29 DIAGNOSIS — E785 Hyperlipidemia, unspecified: Secondary | ICD-10-CM

## 2011-12-29 NOTE — Assessment & Plan Note (Signed)
Currently with no symptoms of angina. No further workup at this time. Continue current medication regimen. 

## 2011-12-29 NOTE — Progress Notes (Signed)
Patient ID: Travis [name], male    DOB: [birth year], 78 y.o.   MRN: [id number]  HPI Comments: [name] is a  78 year old male with a history of coronary artery disease, status post non-ST-elevation myocardial infarction in May 2006, CABG, history of sleep apnea, mild to moderate aortic insufficiency,  and a history of pulmonary embolus in the setting of fall in 2005,  Does not drink much fluids And has underlying renal insufficiency, Recent development of left lower extremity edema, presented to the hospital December 24 with ultrasound showing near complete occlusive thrombus of the left lower extremity extending from the common femoral vein through the superficial femoral vein to the popliteal vein. He was started on warfarin in the hospital and discharged. Shortly after, he developed what was felt to be blue toe Syndrome and he was changed to pradaxa 75 mg b.i.d.Marland Kitchen Xarelto was not used as there was no renal dose available.  He was found to be factor V Leiden positive  He has been wearing his TED hose, regulation with improvement of his left lower external swelling. He is very hard of hearing and most of the history is from his daughter. His other daughter is very nervous about him being on anticoagulation. He does not have any bleeding and overall feels well with no complaints. The daughter denies that he has had any recent falls. The discoloration of his feet has persisted though slightly better with elevation.   Denies CP, sob or dizziness. No CHF symptoms. No palpitations. Compliant with medications. Visit is essentially unchanged from his previous visit in 2011. He denies any falls.  ECHO 4/11 EF 50-55% mild to moderate AI, mild MR.    Old EKG shows normal sinus rhythm with rate 90 beats per minute with right bundle branch block         Outpatient Encounter Prescriptions as of 12/29/2011  Medication Sig Dispense Refill  . atorvastatin (LIPITOR) 80 MG tablet Take 80 mg by mouth daily.  Taking 1/2 tablet       . carvedilol (COREG) 3.125 MG tablet Take 1 tablet (3.125 mg total) by mouth 2 (two) times daily.  60 tablet  11  . dabigatran (PRADAXA) 75 MG CAPS Take 1 capsule (75 mg total) by mouth every 12 (twelve) hours.  60 capsule  3  . docusate sodium (COLACE) 100 MG capsule Take 100 mg by mouth 2 (two) times daily.        Marland Kitchen dutasteride (AVODART) 0.5 MG capsule Take 0.5 mg by mouth. Take on Tuesday, Thursday, and Saturday.       Marland Kitchen omeprazole (PRILOSEC) 20 MG capsule Take 1 capsule (20 mg total) by mouth daily.  30 capsule  6  . aspirin 81 MG EC tablet Take 81 mg by mouth daily.           Review of Systems  Constitutional: Negative.   HENT: Positive for hearing loss.   Eyes: Negative.   Respiratory: Negative.   Cardiovascular: Positive for leg swelling.  Gastrointestinal: Negative.   Musculoskeletal: Positive for gait problem.  Skin: Negative.   Neurological: Negative.   Hematological: Negative.   Psychiatric/Behavioral: Negative.   All other systems reviewed and are negative.    BP 110/62  Pulse 74  Ht 5\' 8"  (1.727 m)  Wt 144 lb 1.9 oz (65.372 kg)  BMI 21.91 kg/m2   Physical Exam  Nursing note and vitals reviewed. Constitutional: He is oriented to person, place, and time. He appears well-developed and well-nourished.  HENT:  Head: Normocephalic.  Nose: Nose normal.  Mouth/Throat: Oropharynx is clear and moist.  Eyes: Conjunctivae are normal. Pupils are equal, round, and reactive to light.  Neck: Normal range of motion. Neck supple. No JVD present.  Cardiovascular: Normal rate, regular rhythm, S1 normal, S2 normal and intact distal pulses.  Exam reveals no gallop and no friction rub.   Murmur heard.  Crescendo systolic murmur is present with a grade of 2/6       Left lower extremity with nonpitting edema to below the knee, discoloration of the foot and toes appearing moderately purple  Pulmonary/Chest: Effort normal and breath sounds normal. No  respiratory distress. He has no wheezes. He has no rales. He exhibits no tenderness.  Abdominal: Soft. Bowel sounds are normal. He exhibits no distension. There is no tenderness.  Musculoskeletal: Normal range of motion. He exhibits no edema and no tenderness.  Lymphadenopathy:    He has no cervical adenopathy.  Neurological: He is alert and oriented to person, place, and time. Coordination normal.  Skin: Skin is warm and dry. No rash noted. No erythema.  Psychiatric: He has a normal mood and affect. His behavior is normal. Judgment and thought content normal.           Assessment and Plan

## 2011-12-29 NOTE — Patient Instructions (Signed)
You are doing well. Please decrease the aspirin to one a day (81 mg) Continue pradaxa 75 mg twice a day  Please call us if you have new issues that need to be addressed before your next appt.  Your physician wants you to follow-up in: 6 months.  You will receive a reminder letter in the mail two months in advance. If you don't receive a letter, please call our office to schedule the follow-up appointment.

## 2011-12-29 NOTE — Assessment & Plan Note (Signed)
We'll continue him on Lipitor. LDL slightly above goal

## 2011-12-29 NOTE — Assessment & Plan Note (Signed)
We have suggested that he continue on the anticoagulation b.i.d. At the renal dose. If they choose not to continue anticoagulation, which would be their choice, we would recommend a IVC filter. We have suggested we reimage his thrombus in 3 months time as they are nervous about him being on anticoagulation for a prolonged period of time

## 2011-12-29 NOTE — Assessment & Plan Note (Signed)
Possible blue toe syndrome from being on warfarin. We'll continue pradaxa for now.

## 2012-01-05 ENCOUNTER — Encounter: Payer: Self-pay | Admitting: Cardiovascular Disease

## 2012-01-08 ENCOUNTER — Telehealth: Payer: Self-pay | Admitting: Cardiovascular Disease

## 2012-01-08 NOTE — Telephone Encounter (Signed)
Spoke to pt's daughter, he was given closed toe TED hose w/ 30-40 mmHg, and c/o pain in heel and top of foot due to being so tight. Daughter knows Dr. Mariah Milling wanted the closed toe b/c open before was incr swelling in his toes. His swelling in feet are improving, but daughter asking if possibly could decr pressure to 20-30 to relieve pain, can still keep closed toe. She has someone that can order for her, she just wanted to make sure Dr. Mariah Milling ok with the decrease. Please advise.

## 2012-01-08 NOTE — Telephone Encounter (Signed)
Per Dr. Mariah Milling, notified daughter that its ok to decr to 20-30 mmHg closed toe and suggested he may want to wear 30-40 in AM and switch to 20-30 later in day to prevent pain. Daughter agreeable, and will call back if has any problems with ordering.

## 2012-01-08 NOTE — Telephone Encounter (Signed)
Received voicemail from the weekend from patient's daughter Travis [name].  She stated that the compression hose are so tight they are causing heel pain.  Would like for RN to call her back to discuss what they need to do.

## 2012-01-09 ENCOUNTER — Encounter: Payer: Self-pay | Admitting: Internal Medicine

## 2012-01-09 ENCOUNTER — Ambulatory Visit (INDEPENDENT_AMBULATORY_CARE_PROVIDER_SITE_OTHER): Payer: Medicare Other | Admitting: Internal Medicine

## 2012-01-09 VITALS — BP 102/58 | HR 79 | Temp 97.0°F | Ht 64.0 in | Wt 144.0 lb

## 2012-01-09 DIAGNOSIS — R0989 Other specified symptoms and signs involving the circulatory and respiratory systems: Secondary | ICD-10-CM

## 2012-01-09 DIAGNOSIS — R0600 Dyspnea, unspecified: Secondary | ICD-10-CM | POA: Insufficient documentation

## 2012-01-09 DIAGNOSIS — D510 Vitamin B12 deficiency anemia due to intrinsic factor deficiency: Secondary | ICD-10-CM

## 2012-01-09 DIAGNOSIS — R0609 Other forms of dyspnea: Secondary | ICD-10-CM

## 2012-01-09 DIAGNOSIS — E039 Hypothyroidism, unspecified: Secondary | ICD-10-CM

## 2012-01-09 DIAGNOSIS — R5381 Other malaise: Secondary | ICD-10-CM

## 2012-01-09 DIAGNOSIS — R5383 Other fatigue: Secondary | ICD-10-CM

## 2012-01-09 DIAGNOSIS — I82409 Acute embolism and thrombosis of unspecified deep veins of unspecified lower extremity: Secondary | ICD-10-CM

## 2012-01-09 DIAGNOSIS — R230 Cyanosis: Secondary | ICD-10-CM

## 2012-01-09 LAB — CBC WITH DIFFERENTIAL/PLATELET
Basophils Absolute: 0 10*3/uL (ref 0.0–0.1)
Eosinophils Absolute: 0.2 10*3/uL (ref 0.0–0.7)
Hemoglobin: 12.4 g/dL — ABNORMAL LOW (ref 13.0–17.0)
Lymphocytes Relative: 23 % (ref 12.0–46.0)
Lymphs Abs: 1.6 10*3/uL (ref 0.7–4.0)
MCHC: 33.7 g/dL (ref 30.0–36.0)
MCV: 94.9 fl (ref 78.0–100.0)
Monocytes Absolute: 0.6 10*3/uL (ref 0.1–1.0)
Neutro Abs: 4.6 10*3/uL (ref 1.4–7.7)
RDW: 14.6 % (ref 11.5–14.6)

## 2012-01-09 LAB — COMPREHENSIVE METABOLIC PANEL
ALT: 18 U/L (ref 0–53)
Albumin: 3.6 g/dL (ref 3.5–5.2)
CO2: 25 mEq/L (ref 19–32)
Calcium: 8.6 mg/dL (ref 8.4–10.5)
Glucose, Bld: 100 mg/dL — ABNORMAL HIGH (ref 70–99)
Sodium: 139 mEq/L (ref 135–145)
Total Bilirubin: 0.5 mg/dL (ref 0.3–1.2)

## 2012-01-09 NOTE — Assessment & Plan Note (Signed)
Patient was recently hospitalized for DVT in his left lower extremity. He was initially placed on Coumadin but then changed to Pradaxa after experiencing possible blue toe syndrome. We'll continue Pradaxa for now.Follow up 1 month. Plan for repeat LE doppler in 6 months.

## 2012-01-09 NOTE — Progress Notes (Signed)
Subjective:    Patient ID: Travis [name], male    DOB: [birth year], 78 y.o.   MRN: [id number]  HPI 78 year old male with a history of CAD, hyperlipidemia, hypertension and PE several years ago, as well as recent hospitalization for left lower extremity DVT presents to establish care. Patient reports that he is generally feeling well. He has noticed some mild fatigue since his discharge from the hospital. However, he notes that he is able to participate in activities such as chopping wood on his property without any difficulty. He notes some shortness of breath particularly in the morning time. This has been ongoing for several months. His daughter reports history of diagnosis of central sleep apnea, however notes that he was unable to tolerate the mask with the CPAP machine. He denies any chest pain, palpitations, diaphoresis, or nausea.  He is concerned today about purple blue discoloration of his bilateral lower extremities. This was first noticed after starting warfarin. He was diagnosed as having blue toe syndrome and warfarin was change to Pradaxa. The discoloration has not changed. It appears to be worse when his legs are in a dependent position. It is improved when his legs are elevated. He denies any pain in his lower extremities. He denies any skin wounds.  Outpatient Encounter Prescriptions as of 01/09/2012  Medication Sig Dispense Refill  . aspirin 81 MG EC tablet Take 81 mg by mouth daily.        Marland Kitchen atorvastatin (LIPITOR) 80 MG tablet Take 80 mg by mouth daily. Taking 1/2 tablet       . carvedilol (COREG) 3.125 MG tablet Take 1 tablet (3.125 mg total) by mouth 2 (two) times daily.  60 tablet  11  . ciprofloxacin-dexamethasone (CIPRODEX) otic suspension Place 4 drops into the right ear 2 (two) times daily.      . dabigatran (PRADAXA) 75 MG CAPS Take 1 capsule (75 mg total) by mouth every 12 (twelve) hours.  60 capsule  3  . docusate sodium (COLACE) 100 MG capsule Take 100 mg by mouth 2 (two)  times daily.        Marland Kitchen dutasteride (AVODART) 0.5 MG capsule Take 0.5 mg by mouth. Take on Tuesday, Thursday, and Saturday.       Marland Kitchen omeprazole (PRILOSEC) 20 MG capsule Take 1 capsule (20 mg total) by mouth daily.  30 capsule  6    Review of Systems  Constitutional: Negative for fever, chills, activity change, appetite change, fatigue and unexpected weight change.  Eyes: Negative for visual disturbance.  Respiratory: Positive for shortness of breath. Negative for cough.   Cardiovascular: Negative for chest pain, palpitations and leg swelling.  Gastrointestinal: Negative for abdominal pain and abdominal distention.  Genitourinary: Negative for dysuria, urgency and difficulty urinating.  Musculoskeletal: Negative for arthralgias and gait problem.  Skin: Negative for color change and rash.  Hematological: Negative for adenopathy.  Psychiatric/Behavioral: Negative for sleep disturbance and dysphoric mood. The patient is not nervous/anxious.    BP 102/58  Pulse 79  Temp(Src) 97 F (36.1 C) (Oral)  Ht 5\' 4"  (1.626 m)  Wt 144 lb (65.318 kg)  BMI 24.72 kg/m2  SpO2 93%     Objective:   Physical Exam  Constitutional: He is oriented to person, place, and time. He appears well-developed and well-nourished. No distress.  HENT:  Head: Normocephalic and atraumatic.  Right Ear: External ear normal.  Left Ear: External ear normal.  Nose: Nose normal.  Mouth/Throat: Oropharynx is clear and moist. No oropharyngeal exudate.  Eyes: Conjunctivae and EOM are normal. Pupils are equal, round, and reactive to light. Right eye exhibits no discharge. Left eye exhibits no discharge. No scleral icterus.  Neck: Normal range of motion. Neck supple. No tracheal deviation present. No thyromegaly present.  Cardiovascular: Normal rate and regular rhythm.  Exam reveals no gallop and no friction rub.   Murmur heard. Pulmonary/Chest: Effort normal and breath sounds normal. No respiratory distress. He has no wheezes.  He has no rales. He exhibits no tenderness.  Musculoskeletal: Normal range of motion. He exhibits no edema.  Lymphadenopathy:    He has no cervical adenopathy.  Neurological: He is alert and oriented to person, place, and time. No cranial nerve deficit. Coordination normal.  Skin: Skin is warm and dry. No rash noted. He is not diaphoretic. No erythema. No pallor.     Psychiatric: He has a normal mood and affect. His behavior is normal. Judgment and thought content normal.          Assessment & Plan:

## 2012-01-09 NOTE — Assessment & Plan Note (Signed)
Patient was recently treated with Coumadin and there was a question of blue toe syndrome. However, he continues to have cyanosis of both of his lower extremities to his heel. Would like to get arterial Dopplers of both his lower extremities to look for any occlusion. We will set this up today. Patient will followup in one month.

## 2012-01-09 NOTE — Assessment & Plan Note (Signed)
Most noted in the morning. Pt appears to have excellent endurance, and has been performing activities such as chopping wood in the afternoon without dyspnea. Pt has h/o central sleep apnea. Question if nighttime hypoxia may be contributing to symptoms. Will get records on sleep study. Will have him follow up 1 month.

## 2012-01-10 ENCOUNTER — Telehealth: Payer: Self-pay | Admitting: Internal Medicine

## 2012-01-10 DIAGNOSIS — R5383 Other fatigue: Secondary | ICD-10-CM

## 2012-01-10 DIAGNOSIS — G4731 Primary central sleep apnea: Secondary | ICD-10-CM

## 2012-01-10 NOTE — Telephone Encounter (Signed)
I reviewed sleep study from 2008. Study showed severe sleep apnea. However, was central sleep apnea.  They felt he would benefit symptomatically from CPAP.  I think this may be contributing to fatigue.  I would recommend that we set him up with Dr. Bethel Born, a sleep specialist at [facility] for evaluation and possible repeat study and/or trial of new mask.

## 2012-01-11 NOTE — Telephone Encounter (Signed)
Referral entered, My chart message sent

## 2012-01-30 ENCOUNTER — Telehealth: Payer: Self-pay | Admitting: Internal Medicine

## 2012-01-30 NOTE — Telephone Encounter (Signed)
alica in vasular lab called and wanted to know if you knew mr Riebel had acut dvt chirstmas  Common femiral superfiscial femiral and poplitill veins.  They wanted to make sure you stilled abi and lower extremty artrial.  They wanted to know if you wanted to do abi and veinous.  Alicia at [facility] will able to clarify this if you need additional information [phone]  Ask for alicia in the vascular lab

## 2012-01-31 NOTE — Telephone Encounter (Signed)
Lets do ABI and venous doppler then. If ABI abnormal, then will add arterial doppler.

## 2012-02-01 NOTE — Telephone Encounter (Signed)
Carollee Herter, can you help? This is about recent referral, thanks

## 2012-02-05 NOTE — Telephone Encounter (Signed)
This referral was already made, and the  Appointment is 02/05/12 @ 2:45  Arrive @ 2:30 Pt daughter charlotte aware of appoinment

## 2012-02-09 ENCOUNTER — Ambulatory Visit: Payer: Medicare Other | Admitting: Internal Medicine

## 2012-02-23 ENCOUNTER — Encounter: Payer: Self-pay | Admitting: Internal Medicine

## 2012-02-23 ENCOUNTER — Ambulatory Visit (INDEPENDENT_AMBULATORY_CARE_PROVIDER_SITE_OTHER): Payer: Medicare Other | Admitting: Internal Medicine

## 2012-02-23 VITALS — BP 122/62 | HR 104 | Wt 140.0 lb

## 2012-02-23 DIAGNOSIS — G4730 Sleep apnea, unspecified: Secondary | ICD-10-CM

## 2012-02-23 DIAGNOSIS — G4731 Primary central sleep apnea: Secondary | ICD-10-CM

## 2012-02-23 DIAGNOSIS — R230 Cyanosis: Secondary | ICD-10-CM

## 2012-02-23 NOTE — Progress Notes (Signed)
Subjective:    Patient ID: Travis [name], male    DOB: 06-16-1916, 78 y.o.   MRN: [id number]  HPI 78 year old male with history of hypertension, DVT, and lower extremity edema presents for followup. In regards to his lower extremity edema and discoloration of his left distal lower extremity, he reports some improvement since last visit. He was seen by vascular surgeon who performed a lower extremity arterial and venous Dopplers which were normal. He was started using compression stockings and has noted significant improvement in his lower extremity swelling and purplish discoloration in his left distal lower extremity.  In the interim since his last visit, I also reviewed his sleep study from 2008. This showed severe central sleep apnea. His daughter reports that he was unable to tolerate any mask for CPAP. We have set up referral to sleep specialist to help with sleep mask placement. He continues to have some fatigue and cough with shortness of breath in the mornings.    Outpatient Encounter Prescriptions as of 02/23/2012  Medication Sig Dispense Refill  . aspirin 81 MG EC tablet Take 81 mg by mouth daily.        Marland Kitchen atorvastatin (LIPITOR) 80 MG tablet Take 80 mg by mouth daily. Taking 1/2 tablet       . carvedilol (COREG) 3.125 MG tablet Take 1 tablet (3.125 mg total) by mouth 2 (two) times daily.  60 tablet  11  . dabigatran (PRADAXA) 75 MG CAPS Take 1 capsule (75 mg total) by mouth every 12 (twelve) hours.  60 capsule  3  . docusate sodium (COLACE) 100 MG capsule Take 100 mg by mouth 2 (two) times daily.        Marland Kitchen dutasteride (AVODART) 0.5 MG capsule Take 0.5 mg by mouth. Take on Tuesday, Thursday, and Saturday.       Marland Kitchen omeprazole (PRILOSEC) 20 MG capsule Take 1 capsule (20 mg total) by mouth daily.  30 capsule  6    Review of Systems  Constitutional: Negative for fever, chills, activity change, appetite change, fatigue and unexpected weight change.  Eyes: Negative for visual disturbance.    Respiratory: Positive for shortness of breath. Negative for cough.   Cardiovascular: Positive for leg swelling. Negative for chest pain and palpitations.  Gastrointestinal: Negative for abdominal pain and abdominal distention.  Genitourinary: Negative for dysuria, urgency and difficulty urinating.  Musculoskeletal: Negative for arthralgias and gait problem.  Skin: Negative for color change and rash.  Hematological: Negative for adenopathy.  Psychiatric/Behavioral: Negative for sleep disturbance and dysphoric mood. The patient is not nervous/anxious.    BP 122/62  Pulse 104  Wt 140 lb (63.504 kg)  SpO2 95%     Objective:   Physical Exam  Constitutional: He is oriented to person, place, and time. He appears well-developed and well-nourished. No distress.  HENT:  Head: Normocephalic and atraumatic.  Right Ear: External ear normal.  Left Ear: External ear normal.  Nose: Nose normal.  Mouth/Throat: Oropharynx is clear and moist. No oropharyngeal exudate.  Eyes: Conjunctivae and EOM are normal. Pupils are equal, round, and reactive to light. Right eye exhibits no discharge. Left eye exhibits no discharge. No scleral icterus.  Neck: Normal range of motion. Neck supple. No tracheal deviation present. No thyromegaly present.  Cardiovascular: Normal rate, regular rhythm and normal heart sounds.  Exam reveals no gallop and no friction rub.   No murmur heard. Pulmonary/Chest: Effort normal and breath sounds normal. No respiratory distress. He has no wheezes. He has no  rales. He exhibits no tenderness.  Musculoskeletal: Normal range of motion. He exhibits edema (trace to mid shin).  Lymphadenopathy:    He has no cervical adenopathy.  Neurological: He is alert and oriented to person, place, and time. No cranial nerve deficit. Coordination normal.  Skin: Skin is warm and dry. No rash noted. He is not diaphoretic. No erythema. No pallor.     Psychiatric: He has a normal mood and affect. His  behavior is normal. Judgment and thought content normal.          Assessment & Plan:

## 2012-02-23 NOTE — Assessment & Plan Note (Signed)
Based on sleep study performed at [facility] in 2008. Patient was unable to tolerate CPAP mask at that time. He would like to try alternative mask at this time. Will set up referral to sleep specialist.

## 2012-02-23 NOTE — Assessment & Plan Note (Signed)
Improved since his last visit. Lower extremity edema also improved with use of compression stockings. Lower extremity arterial and venous Dopplers were normal. We'll continue to monitor.

## 2012-03-01 ENCOUNTER — Telehealth: Payer: Self-pay

## 2012-03-01 MED ORDER — OMEPRAZOLE 20 MG PO CPDR: 20.0000 mg | DELAYED_RELEASE_CAPSULE | Freq: Every day | ORAL | Status: DC

## 2012-03-01 NOTE — Telephone Encounter (Signed)
Refill sent for omeprazole.  

## 2012-03-04 ENCOUNTER — Encounter: Payer: Self-pay | Admitting: Internal Medicine

## 2012-04-10 ENCOUNTER — Ambulatory Visit: Payer: Self-pay

## 2012-04-10 DIAGNOSIS — I82409 Acute embolism and thrombosis of unspecified deep veins of unspecified lower extremity: Secondary | ICD-10-CM

## 2012-05-24 ENCOUNTER — Encounter: Payer: Self-pay | Admitting: Internal Medicine

## 2012-05-24 ENCOUNTER — Ambulatory Visit (INDEPENDENT_AMBULATORY_CARE_PROVIDER_SITE_OTHER): Payer: Medicare Other | Admitting: Internal Medicine

## 2012-05-24 VITALS — BP 122/78 | HR 71 | Temp 98.6°F | Wt 144.0 lb

## 2012-05-24 DIAGNOSIS — R0609 Other forms of dyspnea: Secondary | ICD-10-CM

## 2012-05-24 DIAGNOSIS — G4730 Sleep apnea, unspecified: Secondary | ICD-10-CM

## 2012-05-24 DIAGNOSIS — E785 Hyperlipidemia, unspecified: Secondary | ICD-10-CM | POA: Insufficient documentation

## 2012-05-24 DIAGNOSIS — R230 Cyanosis: Secondary | ICD-10-CM

## 2012-05-24 DIAGNOSIS — I82409 Acute embolism and thrombosis of unspecified deep veins of unspecified lower extremity: Secondary | ICD-10-CM

## 2012-05-24 DIAGNOSIS — G4731 Primary central sleep apnea: Secondary | ICD-10-CM

## 2012-05-24 DIAGNOSIS — D649 Anemia, unspecified: Secondary | ICD-10-CM

## 2012-05-24 DIAGNOSIS — R0600 Dyspnea, unspecified: Secondary | ICD-10-CM

## 2012-05-24 LAB — CBC WITH DIFFERENTIAL/PLATELET
HCT: 36.8 % — ABNORMAL LOW (ref 39.0–52.0)
Hemoglobin: 12.7 g/dL — ABNORMAL LOW (ref 13.0–17.0)
Lymphocytes Relative: 32 % (ref 12–46)
Lymphs Abs: 2.3 10*3/uL (ref 0.7–4.0)
Monocytes Absolute: 0.7 10*3/uL (ref 0.1–1.0)
Monocytes Relative: 9 % (ref 3–12)
Neutro Abs: 3.8 10*3/uL (ref 1.7–7.7)
Neutrophils Relative %: 54 % (ref 43–77)
RBC: 3.96 MIL/uL — ABNORMAL LOW (ref 4.22–5.81)

## 2012-05-24 LAB — COMPREHENSIVE METABOLIC PANEL
Albumin: 3.6 g/dL (ref 3.5–5.2)
BUN: 26 mg/dL — ABNORMAL HIGH (ref 6–23)
Calcium: 8.6 mg/dL (ref 8.4–10.5)
Chloride: 104 mEq/L (ref 96–112)
Creat: 1.68 mg/dL — ABNORMAL HIGH (ref 0.50–1.35)
Glucose, Bld: 93 mg/dL (ref 70–99)
Potassium: 4.2 mEq/L (ref 3.5–5.3)

## 2012-05-24 NOTE — Assessment & Plan Note (Signed)
There has been question in the past whether untreated sleep apnea is playing a role in chronic dyspnea, most prominent in the morning. Sleep study scheduled for today.

## 2012-05-24 NOTE — Assessment & Plan Note (Signed)
Doing well on Pradaxa. Will plan to continue.

## 2012-05-24 NOTE — Assessment & Plan Note (Signed)
Symptoms improved with the use of compression stockings. Vascular evaluation including lower extremity arterial and venous Dopplers were normal. Will continue to monitor.

## 2012-05-24 NOTE — Assessment & Plan Note (Signed)
Repeat sleep study is planned for today.

## 2012-05-24 NOTE — Progress Notes (Signed)
Subjective:    Patient ID: Travis [name], male    DOB: 06-Sep-1916, 78 y.o.   MRN: [id number]  HPI 78 year old male with hypertension, hyperlipidemia, history of DVT, sleep apnea, and chronic venous insufficiency with cyanotic feet presents for followup. He reports he is doing well. He reports some weakness and fatigue but describes this as mild. It is unchanged from previous visit. He continues to wear compression stockings on his legs which has helped with cyanosis and swelling. He was recently evaluated by a sleep specialist because of history of abnormal sleep study in the past. Repeat sleep study has been scheduled for today. Question of whether ongoing mild fatigue and some dyspnea in the morning secondary to untreated sleep apnea. His daughter is with him today and denies any new concerns.  Outpatient Encounter Prescriptions as of 05/24/2012  Medication Sig Dispense Refill  . aspirin 81 MG EC tablet Take 81 mg by mouth daily.        Marland Kitchen atorvastatin (LIPITOR) 80 MG tablet Take 80 mg by mouth daily. Taking 1/2 tablet       . carvedilol (COREG) 3.125 MG tablet Take 1 tablet (3.125 mg total) by mouth 2 (two) times daily.  60 tablet  11  . dabigatran (PRADAXA) 75 MG CAPS Take 1 capsule (75 mg total) by mouth every 12 (twelve) hours.  60 capsule  3  . docusate sodium (COLACE) 100 MG capsule Take 100 mg by mouth 2 (two) times daily.        Marland Kitchen dutasteride (AVODART) 0.5 MG capsule Take 0.5 mg by mouth. Take on Tuesday, Thursday, and Saturday.       Marland Kitchen omeprazole (PRILOSEC) 20 MG capsule Take 1 capsule (20 mg total) by mouth daily.  30 capsule  6   BP 122/78  Pulse 71  Temp(Src) 98.6 F (37 C) (Oral)  Wt 144 lb (65.318 kg)  SpO2 97%  Review of Systems  Constitutional: Positive for fatigue. Negative for fever, chills, activity change, appetite change and unexpected weight change.  Eyes: Negative for visual disturbance.  Respiratory: Positive for shortness of breath. Negative for cough.     Cardiovascular: Negative for chest pain, palpitations and leg swelling.  Gastrointestinal: Negative for abdominal pain and abdominal distention.  Genitourinary: Negative for dysuria, urgency and difficulty urinating.  Musculoskeletal: Negative for arthralgias and gait problem.  Skin: Positive for color change. Negative for rash.  Hematological: Negative for adenopathy.  Psychiatric/Behavioral: Negative for sleep disturbance and dysphoric mood. The patient is not nervous/anxious.        Objective:   Physical Exam  Constitutional: He is oriented to person, place, and time. He appears well-developed and well-nourished. No distress.  HENT:  Head: Normocephalic and atraumatic.  Right Ear: External ear normal.  Left Ear: External ear normal.  Nose: Nose normal.  Mouth/Throat: Oropharynx is clear and moist. No oropharyngeal exudate.  Eyes: Conjunctivae and EOM are normal. Pupils are equal, round, and reactive to light. Right eye exhibits no discharge. Left eye exhibits no discharge. No scleral icterus.  Neck: Normal range of motion. Neck supple. No tracheal deviation present. No thyromegaly present.  Cardiovascular: Normal rate, regular rhythm and normal heart sounds.  Exam reveals no gallop and no friction rub.   No murmur heard. Pulmonary/Chest: Effort normal and breath sounds normal. No respiratory distress. He has no wheezes. He has no rales. He exhibits no tenderness.  Musculoskeletal: Normal range of motion. He exhibits no edema.  Lymphadenopathy:    He has no cervical  adenopathy.  Neurological: He is alert and oriented to person, place, and time. No cranial nerve deficit. Coordination normal.  Skin: Skin is warm and dry. No rash noted. He is not diaphoretic. There is cyanosis (feet bilaterally, improves with elevation). No erythema. No pallor.  Psychiatric: He has a normal mood and affect. His behavior is normal. Judgment and thought content normal.          Assessment & Plan:

## 2012-05-24 NOTE — Assessment & Plan Note (Signed)
Mild anemia noted on previous labs. Will repeat CBC today.

## 2012-06-28 ENCOUNTER — Encounter: Payer: Self-pay | Admitting: Cardiovascular Disease

## 2012-06-28 ENCOUNTER — Ambulatory Visit (INDEPENDENT_AMBULATORY_CARE_PROVIDER_SITE_OTHER): Payer: Medicare Other | Admitting: Cardiovascular Disease

## 2012-06-28 VITALS — BP 118/68 | HR 86 | Ht 70.0 in | Wt 146.5 lb

## 2012-06-28 DIAGNOSIS — I2510 Atherosclerotic heart disease of native coronary artery without angina pectoris: Secondary | ICD-10-CM

## 2012-06-28 DIAGNOSIS — R0602 Shortness of breath: Secondary | ICD-10-CM

## 2012-06-28 DIAGNOSIS — I82409 Acute embolism and thrombosis of unspecified deep veins of unspecified lower extremity: Secondary | ICD-10-CM

## 2012-06-28 DIAGNOSIS — E785 Hyperlipidemia, unspecified: Secondary | ICD-10-CM

## 2012-06-28 DIAGNOSIS — I25810 Atherosclerosis of coronary artery bypass graft(s) without angina pectoris: Secondary | ICD-10-CM

## 2012-06-28 NOTE — Assessment & Plan Note (Signed)
We have suggested he stay on his statin 

## 2012-06-28 NOTE — Progress Notes (Signed)
Patient ID: Travis [name], male    DOB: [birth year], 78 y.o.   MRN: [id number]  HPI Comments: Travis [name] is a  78 year old male with a history of coronary artery disease, status post non-ST-elevation myocardial infarction in May 2006, CABG, history of sleep apnea, mild to moderate aortic insufficiency,  and a history of pulmonary embolus in the setting of fall in 2005,  Does not drink much fluids And has underlying renal insufficiency, Recent development of left lower extremity edema, presented to the hospital December 24 with ultrasound showing near complete occlusive thrombus of the left lower extremity extending from the common femoral vein through the superficial femoral vein to the popliteal vein. He was started on warfarin in the hospital and discharged. Shortly after, he developed what was felt to be blue toe Syndrome and he was changed to pradaxa 75 mg b.i.d..  He was found to be factor V Leiden positive  He has been wearing his TED hose, he continues to have mild edema of the left lower extremity. He lives by himself and Travis [name] help to take care of him, visiting occasionally. They are afraid that he's not doing much at home but he does stay active. Recently was working on his lawnmower.  He is very hard of hearing and most of the history is from his daughter. The daughter denies that he has had any recent falls.  Denies CP, sob or dizziness. No CHF symptoms. No palpitations. Compliant with medications.  ECHO 4/11 EF 50-55% mild to moderate AI, mild MR.     EKG shows normal sinus rhythm with rate 87 beats per minute with right bundle branch block        Outpatient Encounter Prescriptions as of 06/28/2012  Medication Sig Dispense Refill  . aspirin 81 MG EC tablet Take 81 mg by mouth daily.        Marland Kitchen atorvastatin (LIPITOR) 80 MG tablet Take 80 mg by mouth daily. Taking 1/2 tablet       . carvedilol (COREG) Travis.125 MG tablet Take 1 tablet (Travis.125 mg total) by mouth 2 (two) times daily.  60  tablet  11  . dabigatran (PRADAXA) 75 MG CAPS Take 1 capsule (75 mg total) by mouth every 12 (twelve) hours.  60 capsule  Travis  . docusate sodium (COLACE) 100 MG capsule Take 100 mg by mouth 2 (two) times daily.        Marland Kitchen dutasteride (AVODART) 0.5 MG capsule Take 0.5 mg by mouth. Take on Tuesday, Thursday, and Saturday.       Marland Kitchen omeprazole (PRILOSEC) 20 MG capsule Take 1 capsule (20 mg total) by mouth daily.  30 capsule  6    Review of Systems  Constitutional: Negative.   HENT: Positive for hearing loss.   Eyes: Negative.   Respiratory: Negative.   Cardiovascular: Positive for leg swelling.  Gastrointestinal: Negative.   Musculoskeletal: Positive for gait problem.  Skin: Negative.   Neurological: Negative.   Hematological: Negative.   Psychiatric/Behavioral: Negative.   All other systems reviewed and are negative.    BP 118/68  Pulse 86  Ht 5\' 10"  (1.778 m)  Wt 146 lb 8 oz (66.452 kg)  BMI 21.02 kg/m2  Physical Exam  Nursing note and vitals reviewed. Constitutional: He is oriented to person, place, and time. He appears well-developed and well-nourished.  HENT:  Head: Normocephalic.  Nose: Nose normal.  Mouth/Throat: Oropharynx is clear and moist.  Eyes: Conjunctivae are normal. Pupils are equal, round, and reactive  to light.  Neck: Normal range of motion. Neck supple. No JVD present.  Cardiovascular: Normal rate, regular rhythm, S1 normal, S2 normal and intact distal pulses.  Exam reveals no gallop and no friction rub.   Murmur heard.  Crescendo systolic murmur is present with a grade of 2/6       Left lower extremity with nonpitting edema to below the knee, discoloration of the foot and toes appearing moderately purple  Pulmonary/Chest: Effort normal and breath sounds normal. No respiratory distress. He has no wheezes. He has no rales. He exhibits no tenderness.  Abdominal: Soft. Bowel sounds are normal. He exhibits no distension. There is no tenderness.  Musculoskeletal:  Normal range of motion. He exhibits no edema and no tenderness.  Lymphadenopathy:    He has no cervical adenopathy.  Neurological: He is alert and oriented to person, place, and time. Coordination normal.  Skin: Skin is warm and dry. No rash noted. No erythema.  Psychiatric: He has a normal mood and affect. His behavior is normal. Judgment and thought content normal.           Assessment and Plan

## 2012-06-28 NOTE — Assessment & Plan Note (Addendum)
No recent falls. We have suggested he stay on anticoagulation for now. Ultrasound earlier this year shows chronic DVT in the left.

## 2012-06-28 NOTE — Assessment & Plan Note (Signed)
Currently with no symptoms of angina. No further workup at this time. Continue current medication regimen. 

## 2012-06-28 NOTE — Patient Instructions (Addendum)
You are doing well. No medication changes were made.  Please call us if you have new issues that need to be addressed before your next appt.  Your physician wants you to follow-up in: 6 months.  You will receive a reminder letter in the mail two months in advance. If you don't receive a letter, please call our office to schedule the follow-up appointment.   

## 2012-07-18 ENCOUNTER — Other Ambulatory Visit: Payer: Self-pay | Admitting: *Deleted

## 2012-07-18 MED ORDER — DABIGATRAN ETEXILATE MESYLATE 75 MG PO CAPS: 75.0000 mg | ORAL_CAPSULE | Freq: Two times a day (BID) | ORAL | Status: DC

## 2012-07-18 NOTE — Telephone Encounter (Signed)
Refilled Pradaxa. 

## 2012-08-23 ENCOUNTER — Other Ambulatory Visit: Payer: Self-pay | Admitting: *Deleted

## 2012-08-23 DIAGNOSIS — I2510 Atherosclerotic heart disease of native coronary artery without angina pectoris: Secondary | ICD-10-CM

## 2012-08-23 MED ORDER — CARVEDILOL 3.125 MG PO TABS: 3.1250 mg | ORAL_TABLET | Freq: Two times a day (BID) | ORAL | Status: DC

## 2012-08-23 NOTE — Telephone Encounter (Signed)
Refilled Carvedilol. 

## 2012-09-06 ENCOUNTER — Ambulatory Visit (INDEPENDENT_AMBULATORY_CARE_PROVIDER_SITE_OTHER): Payer: Medicare Other | Admitting: Internal Medicine

## 2012-09-06 ENCOUNTER — Encounter: Payer: Self-pay | Admitting: Internal Medicine

## 2012-09-06 VITALS — BP 120/82 | HR 60 | Temp 97.8°F | Wt 153.0 lb

## 2012-09-06 DIAGNOSIS — G4730 Sleep apnea, unspecified: Secondary | ICD-10-CM

## 2012-09-06 DIAGNOSIS — H6120 Impacted cerumen, unspecified ear: Secondary | ICD-10-CM

## 2012-09-06 DIAGNOSIS — I10 Essential (primary) hypertension: Secondary | ICD-10-CM

## 2012-09-06 DIAGNOSIS — G4731 Primary central sleep apnea: Secondary | ICD-10-CM

## 2012-09-06 DIAGNOSIS — R609 Edema, unspecified: Secondary | ICD-10-CM | POA: Insufficient documentation

## 2012-09-06 DIAGNOSIS — Z23 Encounter for immunization: Secondary | ICD-10-CM

## 2012-09-06 LAB — COMPREHENSIVE METABOLIC PANEL
Albumin: 3.4 g/dL — ABNORMAL LOW (ref 3.5–5.2)
Alkaline Phosphatase: 75 U/L (ref 39–117)
BUN: 28 mg/dL — ABNORMAL HIGH (ref 6–23)
Calcium: 8.6 mg/dL (ref 8.4–10.5)
Creatinine, Ser: 1.9 mg/dL — ABNORMAL HIGH (ref 0.4–1.5)
Glucose, Bld: 96 mg/dL (ref 70–99)
Potassium: 4.4 mEq/L (ref 3.5–5.1)

## 2012-09-06 MED ORDER — CARBAMIDE PEROXIDE 6.5 % OT SOLN: 5.0000 [drp] | Freq: Two times a day (BID) | OTIC | Status: DC

## 2012-09-06 NOTE — Assessment & Plan Note (Signed)
BP well controlled on current medications. Will recheck renal function with labs today. Follow up 3 months and prn.

## 2012-09-06 NOTE — Assessment & Plan Note (Signed)
Minimal cerumen noted bilateral ears without complete impaction. Will start topical Debrox to help with cerumen removal.

## 2012-09-06 NOTE — Assessment & Plan Note (Signed)
Sleep study 2013 recommended BiPAP, however pt declines therapy.

## 2012-09-06 NOTE — Assessment & Plan Note (Signed)
Left>right LE edema. Likely secondary to chronic venous insufficiency after DVT LLE in the past. Symptoms have been stable. Controlled with use of compression stocking. Will continue to monitor.

## 2012-09-06 NOTE — Progress Notes (Signed)
Subjective:    Patient ID: Travis [name], male    DOB: Jan 31, 1916, 78 y.o.   MRN: [id number]  HPI 78 year old male with history of DVT and PE, hypertension, hyperlipidemia presents for followup. At his last visit, there was some concern about daytime fatigue. He underwent sleep study in July 2013 which showed obstructive sleep apnea and recommended that he use BiPAP. He ultimately decided that he is not interested in using BiPAP or CPAP. He reports that symptoms of fatigue and stable. He denies any dyspnea, chest pain, palpitations. He does note some left greater than right lower extremity edema which is chronic for him. He wears compression stockings almost daily which helps control symptoms. He reports full compliance with medications. He denies any new concerns today.  Outpatient Encounter Prescriptions as of 09/06/2012  Medication Sig Dispense Refill  . atorvastatin (LIPITOR) 80 MG tablet Take 80 mg by mouth daily. Taking 1/2 tablet       . carvedilol (COREG) 3.125 MG tablet Take 1 tablet (3.125 mg total) by mouth 2 (two) times daily.  60 tablet  11  . dabigatran (PRADAXA) 75 MG CAPS Take 1 capsule (75 mg total) by mouth every 12 (twelve) hours.  60 capsule  3  . docusate sodium (COLACE) 100 MG capsule Take 100 mg by mouth 2 (two) times daily.        Marland Kitchen dutasteride (AVODART) 0.5 MG capsule Take 0.5 mg by mouth. Take on Tuesday, Thursday, and Saturday.       Marland Kitchen omeprazole (PRILOSEC) 20 MG capsule Take 1 capsule (20 mg total) by mouth daily.  30 capsule  6  . aspirin 81 MG EC tablet Take 81 mg by mouth daily.        . carbamide peroxide (DEBROX) 6.5 % otic solution Place 5 drops into both ears 2 (two) times daily.  15 mL  0   BP 120/82  Pulse 60  Temp 97.8 F (36.6 C) (Oral)  Wt 153 lb (69.4 kg)  SpO2 95%  Review of Systems  Constitutional: Negative for fever, chills, activity change, appetite change, fatigue and unexpected weight change.  Eyes: Negative for visual disturbance.    Respiratory: Negative for cough and shortness of breath.   Cardiovascular: Positive for leg swelling. Negative for chest pain and palpitations.  Gastrointestinal: Negative for abdominal pain and abdominal distention.  Genitourinary: Negative for dysuria, urgency and difficulty urinating.  Musculoskeletal: Negative for arthralgias and gait problem.  Skin: Negative for color change and rash.  Hematological: Negative for adenopathy.  Psychiatric/Behavioral: Negative for disturbed wake/sleep cycle and dysphoric mood. The patient is not nervous/anxious.        Objective:   Physical Exam  Constitutional: He is oriented to person, place, and time. He appears well-developed and well-nourished. No distress.  HENT:  Head: Normocephalic and atraumatic.  Right Ear: External ear normal.  Left Ear: External ear normal.  Nose: Nose normal.  Mouth/Throat: Oropharynx is clear and moist. No oropharyngeal exudate.       Cerumen bilateral ear canals  Eyes: Conjunctivae normal and EOM are normal. Pupils are equal, round, and reactive to light. Right eye exhibits no discharge. Left eye exhibits no discharge. No scleral icterus.  Neck: Normal range of motion. Neck supple. No tracheal deviation present. No thyromegaly present.  Cardiovascular: Normal rate, regular rhythm and normal heart sounds.  Exam reveals no gallop and no friction rub.   No murmur heard. Pulmonary/Chest: Effort normal and breath sounds normal. No respiratory distress. He has  no wheezes. He has no rales. He exhibits no tenderness.  Musculoskeletal: Normal range of motion. He exhibits edema (left >right to upper shin).  Lymphadenopathy:    He has no cervical adenopathy.  Neurological: He is alert and oriented to person, place, and time. No cranial nerve deficit. Coordination normal.  Skin: Skin is warm and dry. No rash noted. He is not diaphoretic. No erythema. No pallor.  Psychiatric: He has a normal mood and affect. His behavior is  normal. Judgment and thought content normal.          Assessment & Plan:

## 2012-09-30 ENCOUNTER — Other Ambulatory Visit: Payer: Self-pay | Admitting: *Deleted

## 2012-09-30 MED ORDER — ATORVASTATIN CALCIUM 80 MG PO TABS: 80.0000 mg | ORAL_TABLET | Freq: Every day | ORAL | Status: DC

## 2012-09-30 NOTE — Telephone Encounter (Signed)
Refilled Atorvastatin.

## 2012-10-01 ENCOUNTER — Other Ambulatory Visit: Payer: Self-pay

## 2012-10-01 MED ORDER — OMEPRAZOLE 20 MG PO CPDR: 20.0000 mg | DELAYED_RELEASE_CAPSULE | Freq: Every day | ORAL | Status: DC

## 2012-11-19 ENCOUNTER — Other Ambulatory Visit: Payer: Self-pay

## 2012-11-19 MED ORDER — DABIGATRAN ETEXILATE MESYLATE 75 MG PO CAPS: 75.0000 mg | ORAL_CAPSULE | Freq: Two times a day (BID) | ORAL | Status: DC

## 2012-11-19 NOTE — Telephone Encounter (Signed)
Refill sent for pradaxa  

## 2012-11-22 ENCOUNTER — Encounter: Payer: Self-pay | Admitting: Internal Medicine

## 2012-12-06 ENCOUNTER — Ambulatory Visit: Payer: Medicare Other | Admitting: Internal Medicine

## 2012-12-12 ENCOUNTER — Ambulatory Visit (INDEPENDENT_AMBULATORY_CARE_PROVIDER_SITE_OTHER): Payer: Medicare Other | Admitting: Internal Medicine

## 2012-12-12 ENCOUNTER — Encounter: Payer: Self-pay | Admitting: Internal Medicine

## 2012-12-12 VITALS — BP 122/68 | HR 88 | Temp 98.0°F | Resp 16 | Wt 152.2 lb

## 2012-12-12 DIAGNOSIS — E785 Hyperlipidemia, unspecified: Secondary | ICD-10-CM

## 2012-12-12 DIAGNOSIS — D649 Anemia, unspecified: Secondary | ICD-10-CM

## 2012-12-12 DIAGNOSIS — Z1331 Encounter for screening for depression: Secondary | ICD-10-CM

## 2012-12-12 DIAGNOSIS — I10 Essential (primary) hypertension: Secondary | ICD-10-CM

## 2012-12-12 NOTE — Assessment & Plan Note (Signed)
BP Readings from Last 3 Encounters:  12/12/12 122/68  09/06/12 120/82  06/28/12 118/68   Blood pressures have been well-controlled on current medication. Will continue. Will check renal function with labs today. Followup in 3 months or sooner as needed.

## 2012-12-12 NOTE — Assessment & Plan Note (Signed)
Will check lipids and LFTs with labs today. Continue Lipitor.

## 2012-12-12 NOTE — Progress Notes (Signed)
Subjective:    Patient ID: Travis [name], male    DOB: 1916-03-04, 78 y.o.   MRN: [id number]  HPI 78 year old male with history of coronary artery disease, hypertension, hyperlipidemia, atrial fibrillation presents for followup. He reports that he is generally feeling well. He reports mild shortness of breath with exertion but no shortness of breath at rest. This is stable for him. He denies any chest pain or palpitations. He has some chronic swelling in his lower extremities which is controlled with use of compression stockings. He denies any new concerns today.  Outpatient Encounter Prescriptions as of 12/12/2012  Medication Sig Dispense Refill  . aspirin 81 MG EC tablet Take 81 mg by mouth daily.        Marland Kitchen atorvastatin (LIPITOR) 80 MG tablet Take 1 tablet (80 mg total) by mouth daily. Taking 1/2 tablet  30 tablet  5  . carbamide peroxide (DEBROX) 6.5 % otic solution Place 5 drops into both ears 2 (two) times daily.  15 mL  0  . carvedilol (COREG) 3.125 MG tablet Take 1 tablet (3.125 mg total) by mouth 2 (two) times daily.  60 tablet  11  . dabigatran (PRADAXA) 75 MG CAPS Take 1 capsule (75 mg total) by mouth every 12 (twelve) hours.  60 capsule  3  . docusate sodium (COLACE) 100 MG capsule Take 100 mg by mouth 2 (two) times daily.        Marland Kitchen dutasteride (AVODART) 0.5 MG capsule Take 0.5 mg by mouth. Take on Tuesday, Thursday, and Saturday.       Marland Kitchen omeprazole (PRILOSEC) 20 MG capsule Take 1 capsule (20 mg total) by mouth daily.  30 capsule  6   BP 122/68  Pulse 88  Temp 98 F (36.7 C) (Oral)  Resp 16  Wt 152 lb 4 oz (69.06 kg)  SpO2 99%  Review of Systems  Constitutional: Negative for fever, chills, activity change, appetite change, fatigue and unexpected weight change.  HENT: Positive for hearing loss.   Eyes: Negative for visual disturbance.  Respiratory: Negative for cough and shortness of breath.   Cardiovascular: Negative for chest pain, palpitations and leg swelling.    Gastrointestinal: Negative for abdominal pain and abdominal distention.  Genitourinary: Negative for dysuria, urgency and difficulty urinating.  Musculoskeletal: Negative for arthralgias and gait problem.  Skin: Negative for color change and rash.  Hematological: Negative for adenopathy.  Psychiatric/Behavioral: Negative for sleep disturbance and dysphoric mood. The patient is not nervous/anxious.        Objective:   Physical Exam  Constitutional: He is oriented to person, place, and time. He appears well-developed and well-nourished. No distress.  HENT:  Head: Normocephalic and atraumatic.  Right Ear: External ear normal.  Left Ear: External ear normal.  Nose: Nose normal.  Mouth/Throat: Oropharynx is clear and moist. No oropharyngeal exudate.  Eyes: Conjunctivae normal and EOM are normal. Pupils are equal, round, and reactive to light. Right eye exhibits no discharge. Left eye exhibits no discharge. No scleral icterus.  Neck: Normal range of motion. Neck supple. No tracheal deviation present. No thyromegaly present.  Cardiovascular: Normal rate, regular rhythm and normal heart sounds.  Exam reveals no gallop and no friction rub.   No murmur heard. Pulmonary/Chest: Effort normal and breath sounds normal. No respiratory distress. He has no wheezes. He has no rales. He exhibits no tenderness.  Musculoskeletal: Normal range of motion. He exhibits edema (trace bilateral LE to mid shin).  Lymphadenopathy:    He has no  cervical adenopathy.  Neurological: He is alert and oriented to person, place, and time. No cranial nerve deficit. Coordination normal.  Skin: Skin is warm and dry. No rash noted. He is not diaphoretic. No erythema. No pallor.  Psychiatric: He has a normal mood and affect. His behavior is normal. Judgment and thought content normal.          Assessment & Plan:

## 2012-12-12 NOTE — Assessment & Plan Note (Signed)
Previous labs showed mild anemia. Will repeat CBC with labs today.

## 2012-12-13 LAB — COMPREHENSIVE METABOLIC PANEL
AST: 28 U/L (ref 0–37)
Albumin: 3.5 g/dL (ref 3.5–5.2)
Alkaline Phosphatase: 90 U/L (ref 39–117)
Glucose, Bld: 105 mg/dL — ABNORMAL HIGH (ref 70–99)
Potassium: 4.8 mEq/L (ref 3.5–5.1)
Sodium: 138 mEq/L (ref 135–145)
Total Protein: 7.3 g/dL (ref 6.0–8.3)

## 2012-12-13 LAB — LDL CHOLESTEROL, DIRECT: Direct LDL: 83.7 mg/dL

## 2012-12-13 LAB — CBC WITH DIFFERENTIAL/PLATELET
Eosinophils Relative: 7.1 % — ABNORMAL HIGH (ref 0.0–5.0)
HCT: 37.9 % — ABNORMAL LOW (ref 39.0–52.0)
Hemoglobin: 12.8 g/dL — ABNORMAL LOW (ref 13.0–17.0)
Lymphs Abs: 2 10*3/uL (ref 0.7–4.0)
Monocytes Relative: 7 % (ref 3.0–12.0)
Neutro Abs: 4.1 10*3/uL (ref 1.4–7.7)
WBC: 7.2 10*3/uL (ref 4.5–10.5)

## 2012-12-13 LAB — LIPID PANEL: Total CHOL/HDL Ratio: 4

## 2013-01-13 ENCOUNTER — Ambulatory Visit (INDEPENDENT_AMBULATORY_CARE_PROVIDER_SITE_OTHER): Payer: Medicare Other | Admitting: Cardiovascular Disease

## 2013-01-13 ENCOUNTER — Encounter: Payer: Self-pay | Admitting: Cardiovascular Disease

## 2013-01-13 VITALS — BP 90/52 | HR 81 | Ht 64.0 in | Wt 154.0 lb

## 2013-01-13 DIAGNOSIS — I2510 Atherosclerotic heart disease of native coronary artery without angina pectoris: Secondary | ICD-10-CM

## 2013-01-13 DIAGNOSIS — I82409 Acute embolism and thrombosis of unspecified deep veins of unspecified lower extremity: Secondary | ICD-10-CM

## 2013-01-13 DIAGNOSIS — E785 Hyperlipidemia, unspecified: Secondary | ICD-10-CM

## 2013-01-13 DIAGNOSIS — I25810 Atherosclerosis of coronary artery bypass graft(s) without angina pectoris: Secondary | ICD-10-CM

## 2013-01-13 DIAGNOSIS — R609 Edema, unspecified: Secondary | ICD-10-CM

## 2013-01-13 DIAGNOSIS — I10 Essential (primary) hypertension: Secondary | ICD-10-CM

## 2013-01-13 DIAGNOSIS — I951 Orthostatic hypotension: Secondary | ICD-10-CM

## 2013-01-13 NOTE — Assessment & Plan Note (Signed)
Stable minimal edema on today's visit

## 2013-01-13 NOTE — Assessment & Plan Note (Signed)
Currently with no symptoms of angina. No further workup at this time. Continue current medication regimen. 

## 2013-01-13 NOTE — Assessment & Plan Note (Signed)
Blood pressure is very low today. Uncertain if this is secondary to recent addition of prostate medications. We will cut his carvedilol back to one a day. Encouraged high salt intake, fluids. Daughter is with him today and she will encourage fluids. They will buy a blood pressure cuff and call us in the next few days with more blood pressure numbers. She also calls with the prostate medications. Concerned he may be on Flomax or alpha blocker  which could drop his blood pressure.

## 2013-01-13 NOTE — Patient Instructions (Addendum)
Blood pressure is low today, 90/50 Please cut the coreg back to one a day (take at night) Please drink more fluids Would take high salt foods to push blood pressure up  Call the office in the next few days with blood pressure numbers  Please call us if you have new issues that need to be addressed before your next appt.  Your physician wants you to follow-up in: 2 weeks

## 2013-01-13 NOTE — Progress Notes (Signed)
Patient ID: Travis [name], male    DOB: 1916-05-12, 79 y.o.   MRN: [id number]  HPI Comments: Mr. Tuazon is a  79 year old male with a history of coronary artery disease, status post non-ST-elevation myocardial infarction in May 2006, CABG, history of sleep apnea, mild to moderate aortic insufficiency,  and a history of pulmonary embolus in the setting of fall in 2005,   renal insufficiency (poor fluid intake), episode of left lower extremity edema, presented to the hospital December 24 with ultrasound showing near complete occlusive thrombus of the left lower extremity extending from the common femoral vein through the superficial femoral vein to the popliteal vein. He was started on warfarin in the hospital and discharged. Shortly after, he developed what was felt to be blue toe Syndrome and he was changed to pradaxa 75 mg b.i.d..  He was found to be factor V Leiden positive  He has been wearing his TED hose, he continues to have mild edema of the left lower extremity. He lives by himself and 3 daughters help to take care of him, visiting occasionally.  he does stay active. Recently fell into his shower door and broke it. Spent most of the day today walking around lows arranging for a new shower door . He will install his with his grandson who is a Music therapist . No other falls. He does report having some dizziness when he stands up, needs to stand still for a few minutes.  Denies CP, sob. No CHF symptoms. No palpitations. Compliant with medications.  He does report being started on prostate medication by Dr. Lonna Cobb  ECHO 4/11 EF 50-55% mild to moderate AI, mild MR.     EKG shows normal sinus rhythm with rate 81 beats per minute with right bundle branch block        Outpatient Encounter Prescriptions as of 01/13/2013  Medication Sig Dispense Refill  . aspirin 81 MG EC tablet Take 81 mg by mouth daily.        Marland Kitchen atorvastatin (LIPITOR) 80 MG tablet Take 1 tablet (80 mg total) by mouth daily. Taking  1/2 tablet  30 tablet  5  . carbamide peroxide (DEBROX) 6.5 % otic solution Place 5 drops into both ears 2 (two) times daily.  15 mL  0  . carvedilol (COREG) 3.125 MG tablet Take 1 tablet (3.125 mg total) by mouth 2 (two) times daily.  60 tablet  11  . dabigatran (PRADAXA) 75 MG CAPS Take 1 capsule (75 mg total) by mouth every 12 (twelve) hours.  60 capsule  3  . docusate sodium (COLACE) 100 MG capsule Take 100 mg by mouth 2 (two) times daily.        Marland Kitchen dutasteride (AVODART) 0.5 MG capsule Take 0.5 mg by mouth. Take on Tuesday, Thursday, and Saturday.       Marland Kitchen omeprazole (PRILOSEC) 20 MG capsule Take 1 capsule (20 mg total) by mouth daily.  30 capsule  6    Review of Systems  Constitutional: Negative.   HENT: Positive for hearing loss.   Eyes: Negative.   Respiratory: Negative.   Cardiovascular: Positive for leg swelling.  Gastrointestinal: Negative.   Musculoskeletal: Positive for gait problem.  Skin: Negative.   Neurological: Positive for light-headedness.  Hematological: Negative.   Psychiatric/Behavioral: Negative.   All other systems reviewed and are negative.    BP 90/52  Ht 5\' 4"  (1.626 m)  Wt 154 lb (69.854 kg)  BMI 26.43 kg/m2 Repeat blood pressure checks by myself  bilaterally showed consistently low blood pressure systolic of high 16X to 90 Physical Exam  Nursing note and vitals reviewed. Constitutional: He is oriented to person, place, and time. He appears well-developed and well-nourished.  HENT:  Head: Normocephalic.  Nose: Nose normal.  Mouth/Throat: Oropharynx is clear and moist.  Eyes: Conjunctivae normal are normal. Pupils are equal, round, and reactive to light.  Neck: Normal range of motion. Neck supple. No JVD present.  Cardiovascular: Normal rate, regular rhythm, S1 normal, S2 normal and intact distal pulses.  Exam reveals no gallop and no friction rub.   Murmur heard.  Crescendo systolic murmur is present with a grade of 2/6       Left lower extremity  with nonpitting edema to below the knee, discoloration of the foot and toes appearing moderately purple  Pulmonary/Chest: Effort normal and breath sounds normal. No respiratory distress. He has no wheezes. He has no rales. He exhibits no tenderness.  Abdominal: Soft. Bowel sounds are normal. He exhibits no distension. There is no tenderness.  Musculoskeletal: Normal range of motion. He exhibits no edema and no tenderness.  Lymphadenopathy:    He has no cervical adenopathy.  Neurological: He is alert and oriented to person, place, and time. Coordination normal.  Skin: Skin is warm and dry. No rash noted. No erythema.  Psychiatric: He has a normal mood and affect. His behavior is normal. Judgment and thought content normal.           Assessment and Plan

## 2013-01-13 NOTE — Assessment & Plan Note (Signed)
Goal LDL less than 70. Continue current medication management.

## 2013-01-13 NOTE — Assessment & Plan Note (Signed)
We'll continue him on anticoagulation. Recent fall where he caught his foot on a rug. Otherwise has good balance

## 2013-01-14 ENCOUNTER — Telehealth: Payer: Self-pay

## 2013-01-14 ENCOUNTER — Other Ambulatory Visit: Payer: Self-pay

## 2013-01-14 MED ORDER — SOLIFENACIN SUCCINATE 5 MG PO TABS: 5.0000 mg | ORAL_TABLET | Freq: Every day | ORAL | Status: DC

## 2013-01-14 NOTE — Telephone Encounter (Signed)
Would continue to monitor blood pressure. This is much better from prior blood pressure 90 systolic done by nurses and confirmed by me   bilaterally in the office. We'll push fluids and salt Typically that prostate pill does not drop blood pressure

## 2013-01-14 NOTE — Telephone Encounter (Signed)
Pt dtr says pt takes Vesicare for prostate Says BP=124/63 and 123/65 yesterday after appt and new BP cuff Had pt drink 16 ounces of water once he got home I told dtr I would give Dr. Mariah Milling name of med and call her back if he wants to make any changes

## 2013-01-15 ENCOUNTER — Encounter: Payer: Self-pay | Admitting: Cardiovascular Disease

## 2013-01-23 ENCOUNTER — Encounter: Payer: Self-pay | Admitting: Cardiovascular Disease

## 2013-01-24 ENCOUNTER — Ambulatory Visit (INDEPENDENT_AMBULATORY_CARE_PROVIDER_SITE_OTHER): Payer: Medicare Other | Admitting: Cardiovascular Disease

## 2013-01-24 ENCOUNTER — Encounter: Payer: Self-pay | Admitting: Cardiovascular Disease

## 2013-01-24 VITALS — BP 100/62 | HR 86 | Ht 70.0 in | Wt 156.0 lb

## 2013-01-24 DIAGNOSIS — R609 Edema, unspecified: Secondary | ICD-10-CM

## 2013-01-24 DIAGNOSIS — I10 Essential (primary) hypertension: Secondary | ICD-10-CM

## 2013-01-24 DIAGNOSIS — I951 Orthostatic hypotension: Secondary | ICD-10-CM

## 2013-01-24 DIAGNOSIS — I25810 Atherosclerosis of coronary artery bypass graft(s) without angina pectoris: Secondary | ICD-10-CM

## 2013-01-24 NOTE — Patient Instructions (Addendum)
If blood pressure runs low, Push the fluids and salt If it continues to run low,  Hold the coreg until it comes back up  Please call us if you have new issues that need to be addressed before your next appt.  Your physician wants you to follow-up in: 6 months.  You will receive a reminder letter in the mail two months in advance. If you don't receive a letter, please call our office to schedule the follow-up appointment.

## 2013-01-24 NOTE — Assessment & Plan Note (Signed)
Likely secondary to venous insufficiency. He is wearing his compression hose.

## 2013-01-24 NOTE — Assessment & Plan Note (Signed)
His blood pressure numbers over the past 2 weeks were much better though low today with systolic pressure 94 in the office systolic pressure 100. Overall he's been feeling well and under close observation by his daughters. We have recommended he continue to drink more fluids, push high salt foods. If he has dizziness or drop in his pressure, he could hold carvedilol until pressures improve. If blood pressures continue to be a problem, we could add midodrine. We'll avoid Florinef as he has significant edema (venous insufficiency).

## 2013-01-24 NOTE — Progress Notes (Signed)
Patient ID: Travis [name], male    DOB: [birth year], 79 y.o.   MRN: [id number]  HPI Comments: Travis [name] is a  79 year old male with a history of coronary artery disease, status post non-ST-elevation myocardial infarction in May 2006, CABG, history of sleep apnea, mild to moderate aortic insufficiency,  and a history of pulmonary embolus in the setting of fall in 2005,   renal insufficiency (poor fluid intake), episode of left lower extremity edema, presented to the hospital December 24 with ultrasound showing near complete occlusive thrombus of the left lower extremity extending from the common femoral vein through the superficial femoral vein to the popliteal vein. He was started on warfarin in the hospital and discharged. Shortly after, he developed what was felt to be blue toe Syndrome and he was changed to pradaxa 75 mg b.i.d..  He was found to be factor V Leiden positive  He has been wearing his TED hose, he continues to have mild edema of the left lower extremity. He lives by himself and 3 daughters help to take care of him, visiting occasionally.  he does stay active. Recently fell into his shower door and broke it.  On his last clinic visit, he had significant hypotension with dizziness. Encouraged him to increase his fluid intake and salt intake and monitor his blood pressure. Blood pressure at home has been in the 120 range, occasionally 130, rarely 110. At home today systolic pressure was 94. Pressure today in the office was 100. No symptoms of dizziness.  Denies CP, sob. No CHF symptoms. No palpitations.    started on prostate medication by Dr. Lonna Cobb  ECHO 4/11 EF 50-55% mild to moderate AI, mild MR.     EKG shows normal sinus rhythm with rate 86 beats per minute with right bundle branch block        Outpatient Encounter Prescriptions as of 01/24/2013  Medication Sig Dispense Refill  . aspirin 81 MG EC tablet Take 81 mg by mouth daily.        Marland Kitchen atorvastatin (LIPITOR) 80 MG tablet  Take 1 tablet (80 mg total) by mouth daily. Taking 1/2 tablet  30 tablet  5  . carbamide peroxide (DEBROX) 6.5 % otic solution Place 5 drops into both ears 2 (two) times daily.  15 mL  0  . carvedilol (COREG) 3.125 MG tablet Take 1 tablet (3.125 mg total) by mouth 2 (two) times daily.  60 tablet  11  . dabigatran (PRADAXA) 75 MG CAPS Take 1 capsule (75 mg total) by mouth every 12 (twelve) hours.  60 capsule  3  . docusate sodium (COLACE) 100 MG capsule Take 100 mg by mouth 2 (two) times daily.        Marland Kitchen dutasteride (AVODART) 0.5 MG capsule Take 0.5 mg by mouth. Take on Tuesday, Thursday, and Saturday.       Marland Kitchen omeprazole (PRILOSEC) 20 MG capsule Take 1 capsule (20 mg total) by mouth daily.  30 capsule  6  . solifenacin (VESICARE) 5 MG tablet Take 1 tablet (5 mg total) by mouth daily.  30 tablet  0    Review of Systems  Constitutional: Negative.   HENT: Positive for hearing loss.   Eyes: Negative.   Respiratory: Negative.   Cardiovascular: Positive for leg swelling.  Gastrointestinal: Negative.   Musculoskeletal: Positive for gait problem.  Skin: Negative.   Hematological: Negative.   Psychiatric/Behavioral: Negative.   All other systems reviewed and are negative.    BP 100/62  Pulse 86  Ht 5\' 10"  (1.778 m)  Wt 156 lb (70.761 kg)  BMI 22.38 kg/m2  Physical Exam  Nursing note and vitals reviewed. Constitutional: He is oriented to person, place, and time. He appears well-developed and well-nourished.  HENT:  Head: Normocephalic.  Nose: Nose normal.  Mouth/Throat: Oropharynx is clear and moist.  Eyes: Conjunctivae normal are normal. Pupils are equal, round, and reactive to light.  Neck: Normal range of motion. Neck supple. No JVD present.  Cardiovascular: Normal rate, regular rhythm, S1 normal, S2 normal and intact distal pulses.  Exam reveals no gallop and no friction rub.   Murmur heard.  Crescendo systolic murmur is present with a grade of 2/6       Left lower extremity with  nonpitting edema to below the knee, discoloration of the foot and toes appearing moderately purple  Pulmonary/Chest: Effort normal and breath sounds normal. No respiratory distress. He has no wheezes. He has no rales. He exhibits no tenderness.  Abdominal: Soft. Bowel sounds are normal. He exhibits no distension. There is no tenderness.  Musculoskeletal: Normal range of motion. He exhibits no edema and no tenderness.  Lymphadenopathy:    He has no cervical adenopathy.  Neurological: He is alert and oriented to person, place, and time. Coordination normal.  Skin: Skin is warm and dry. No rash noted. No erythema.  Psychiatric: He has a normal mood and affect. His behavior is normal. Judgment and thought content normal.           Assessment and Plan

## 2013-03-14 ENCOUNTER — Ambulatory Visit (INDEPENDENT_AMBULATORY_CARE_PROVIDER_SITE_OTHER): Payer: Medicare Other | Admitting: Internal Medicine

## 2013-03-14 ENCOUNTER — Encounter: Payer: Self-pay | Admitting: Internal Medicine

## 2013-03-14 VITALS — BP 120/80 | HR 90 | Temp 97.7°F | Wt 151.0 lb

## 2013-03-14 DIAGNOSIS — E785 Hyperlipidemia, unspecified: Secondary | ICD-10-CM

## 2013-03-14 DIAGNOSIS — L259 Unspecified contact dermatitis, unspecified cause: Secondary | ICD-10-CM

## 2013-03-14 DIAGNOSIS — D649 Anemia, unspecified: Secondary | ICD-10-CM

## 2013-03-14 DIAGNOSIS — I10 Essential (primary) hypertension: Secondary | ICD-10-CM

## 2013-03-14 DIAGNOSIS — I2510 Atherosclerotic heart disease of native coronary artery without angina pectoris: Secondary | ICD-10-CM

## 2013-03-14 DIAGNOSIS — L309 Dermatitis, unspecified: Secondary | ICD-10-CM | POA: Insufficient documentation

## 2013-03-14 DIAGNOSIS — R609 Edema, unspecified: Secondary | ICD-10-CM

## 2013-03-14 LAB — COMPREHENSIVE METABOLIC PANEL
ALT: 21 U/L (ref 0–53)
Albumin: 3.6 g/dL (ref 3.5–5.2)
CO2: 25 mEq/L (ref 19–32)
Chloride: 102 mEq/L (ref 96–112)
GFR: 28.55 mL/min — ABNORMAL LOW (ref >60.00)
Potassium: 4 mEq/L (ref 3.5–5.1)
Sodium: 136 mEq/L (ref 135–145)
Total Bilirubin: 0.4 mg/dL (ref 0.3–1.2)
Total Protein: 7.4 g/dL (ref 6.0–8.3)

## 2013-03-14 LAB — CBC WITH DIFFERENTIAL/PLATELET
Basophils Absolute: 0 10*3/uL (ref 0.0–0.1)
HCT: 38.7 % — ABNORMAL LOW (ref 39.0–52.0)
Lymphs Abs: 2.1 10*3/uL (ref 0.7–4.0)
Monocytes Absolute: 0.6 10*3/uL (ref 0.1–1.0)
Monocytes Relative: 8.5 % (ref 3.0–12.0)
Neutrophils Relative %: 58.2 % (ref 43.0–77.0)
Platelets: 219 10*3/uL (ref 150.0–400.0)
RDW: 13.1 % (ref 11.5–14.6)

## 2013-03-14 MED ORDER — NYSTATIN-TRIAMCINOLONE 100000-0.1 UNIT/GM-% EX OINT: TOPICAL_OINTMENT | Freq: Two times a day (BID) | CUTANEOUS | Status: DC

## 2013-03-14 NOTE — Assessment & Plan Note (Signed)
Symptoms well controlled with compression stockings. Will continue.

## 2013-03-14 NOTE — Assessment & Plan Note (Signed)
Mild dermatitis noted right posterior ear at the site of his hearing aid. We'll have him apply topical triamcinolone/nystatin ointment at bedtime. Patient's daughter will call if symptoms are not improving.

## 2013-03-14 NOTE — Assessment & Plan Note (Signed)
Currently asymptomatic. Will continue current medications.

## 2013-03-14 NOTE — Assessment & Plan Note (Signed)
BP Readings from Last 3 Encounters:  03/14/13 120/80  01/24/13 100/62  01/13/13 90/52   Blood pressure well-controlled on current medications. Will continue.

## 2013-03-14 NOTE — Progress Notes (Signed)
Subjective:    Patient ID: Travis [name], male    DOB: [birth year], 79 y.o.   MRN: [id number]  HPI 79 year old male with history of coronary artery disease, hyperlipidemia, hypertension, anemia presents for followup. He reports he is generally feeling well. He has some mild dyspnea with exertion. He is generally sedentary. He denies any chest pain or palpitations. He reports good appetite. He denies any change in bowel habits. He reports that swelling in his legs has been well-controlled with use of compression stockings. His daughter reports a rash over his right upper tear at the site of his hearing a. She is unsure how long this has been present. It does not seem to bother him.  Outpatient Encounter Prescriptions as of 03/14/2013  Medication Sig Dispense Refill  . aspirin 81 MG EC tablet Take 81 mg by mouth daily.        Marland Kitchen atorvastatin (LIPITOR) 80 MG tablet Take 1 tablet (80 mg total) by mouth daily. Taking 1/2 tablet  30 tablet  5  . carbamide peroxide (DEBROX) 6.5 % otic solution Place 5 drops into both ears 2 (two) times daily.  15 mL  0  . carvedilol (COREG) 3.125 MG tablet Take 1 tablet (3.125 mg total) by mouth 2 (two) times daily.  60 tablet  11  . dabigatran (PRADAXA) 75 MG CAPS Take 1 capsule (75 mg total) by mouth every 12 (twelve) hours.  60 capsule  3  . docusate sodium (COLACE) 100 MG capsule Take 100 mg by mouth 2 (two) times daily.        Marland Kitchen dutasteride (AVODART) 0.5 MG capsule Take 0.5 mg by mouth. Take on Tuesday, Thursday, and Saturday.       Marland Kitchen omeprazole (PRILOSEC) 20 MG capsule Take 1 capsule (20 mg total) by mouth daily.  30 capsule  6  . solifenacin (VESICARE) 5 MG tablet Take 1 tablet (5 mg total) by mouth daily.  30 tablet  0  . nystatin-triamcinolone ointment (MYCOLOG) Apply topically 2 (two) times daily.  30 g  0   No facility-administered encounter medications on file as of 03/14/2013.   BP 120/80  Pulse 90  Temp(Src) 97.7 F (36.5 C) (Oral)  Wt 151 lb (68.493  kg)  BMI 21.67 kg/m2  SpO2 95%  Review of Systems  Constitutional: Negative for fever, chills, activity change, appetite change, fatigue and unexpected weight change.  Eyes: Negative for visual disturbance.  Respiratory: Negative for cough and shortness of breath.   Cardiovascular: Negative for chest pain, palpitations and leg swelling.  Gastrointestinal: Negative for abdominal pain and abdominal distention.  Genitourinary: Negative for dysuria, urgency and difficulty urinating.  Musculoskeletal: Negative for arthralgias and gait problem.  Skin: Positive for rash. Negative for color change.  Hematological: Negative for adenopathy.  Psychiatric/Behavioral: Negative for sleep disturbance and dysphoric mood. The patient is not nervous/anxious.        Objective:   Physical Exam  Constitutional: He is oriented to person, place, and time. He appears well-developed and well-nourished. No distress.  HENT:  Head: Normocephalic and atraumatic.  Right Ear: External ear normal.  Left Ear: External ear normal.  Nose: Nose normal.  Mouth/Throat: Oropharynx is clear and moist. No oropharyngeal exudate.  Eyes: Conjunctivae and EOM are normal. Pupils are equal, round, and reactive to light. Right eye exhibits no discharge. Left eye exhibits no discharge. No scleral icterus.  Neck: Normal range of motion. Neck supple. No tracheal deviation present. No thyromegaly present.  Cardiovascular: Normal rate, regular  rhythm and normal heart sounds.  Exam reveals no gallop and no friction rub.   No murmur heard. Pulmonary/Chest: Effort normal and breath sounds normal. No accessory muscle usage. Not tachypneic. No respiratory distress. He has no decreased breath sounds. He has no wheezes. He has no rhonchi. He has no rales. He exhibits no tenderness.  Musculoskeletal: Normal range of motion. He exhibits no edema.  Lymphadenopathy:    He has no cervical adenopathy.  Neurological: He is alert and oriented to  person, place, and time. No cranial nerve deficit. Coordination normal.  Skin: Skin is warm and dry. Rash (erythematous macular rash right posterior ear) noted. He is not diaphoretic. There is erythema. No pallor.  Psychiatric: He has a normal mood and affect. His behavior is normal. Judgment and thought content normal.          Assessment & Plan:

## 2013-03-14 NOTE — Assessment & Plan Note (Signed)
History of mild chronic anemia. Will check CBC with labs today.

## 2013-03-17 ENCOUNTER — Encounter: Payer: Self-pay | Admitting: *Deleted

## 2013-03-18 ENCOUNTER — Telehealth: Payer: Self-pay | Admitting: Internal Medicine

## 2013-03-18 DIAGNOSIS — L309 Dermatitis, unspecified: Secondary | ICD-10-CM

## 2013-03-18 MED ORDER — NYSTATIN-TRIAMCINOLONE 100000-0.1 UNIT/GM-% EX OINT: TOPICAL_OINTMENT | Freq: Two times a day (BID) | CUTANEOUS | Status: DC

## 2013-03-18 NOTE — Telephone Encounter (Signed)
Call patient daughter Lynden Ang and informed her prescription has been faxed and also I mailed a lab letter to patient since he could not hear me on the phone.

## 2013-03-18 NOTE — Telephone Encounter (Signed)
Pt daughter called [facility] computer was down on Friday can you please re send his rx  Margarito Courser Please advise daughter when this is done Wendie Chess  [phone]

## 2013-04-09 ENCOUNTER — Encounter: Payer: Self-pay | Admitting: Cardiovascular Disease

## 2013-04-18 ENCOUNTER — Other Ambulatory Visit: Payer: Self-pay | Admitting: *Deleted

## 2013-04-18 MED ORDER — OMEPRAZOLE 20 MG PO CPDR: 20.0000 mg | DELAYED_RELEASE_CAPSULE | Freq: Every day | ORAL | Status: DC

## 2013-04-18 NOTE — Telephone Encounter (Signed)
Refilled Omeprazole sent to [facility].

## 2013-04-21 ENCOUNTER — Other Ambulatory Visit: Payer: Self-pay | Admitting: *Deleted

## 2013-04-21 MED ORDER — DABIGATRAN ETEXILATE MESYLATE 75 MG PO CAPS: 75.0000 mg | ORAL_CAPSULE | Freq: Two times a day (BID) | ORAL | Status: DC

## 2013-04-21 NOTE — Telephone Encounter (Signed)
Refilled Pradaxa sent to [facility].

## 2013-05-14 ENCOUNTER — Telehealth: Payer: Self-pay | Admitting: Internal Medicine

## 2013-05-14 NOTE — Telephone Encounter (Signed)
See below message  Can i r/s to Friday afternoon  Please advise Pt only has follow up appointment not cpx.     Appointment Request From: Travis [name] With Provider: Wynona Dove, MD [-Primary Care Physician-] Preferred Date Range: Any date 05/14/2013 or later Preferred Times: Any Reason for visit: Annual Physical Comments: My Dad has an appt. on Monday June 30. We are all out-of-towners, and want to go see him for Father's Day w/e. Can we reschedule this appt. for him on Friday afternoon June 13th? It would make less driving for one of [place]. Thanks, Travis [name]

## 2013-05-14 NOTE — Telephone Encounter (Signed)
That will be ok 

## 2013-05-15 NOTE — Telephone Encounter (Signed)
Appointment 6/13 @ 2 sent my chart message

## 2013-05-29 ENCOUNTER — Encounter: Payer: Self-pay | Admitting: Internal Medicine

## 2013-05-30 ENCOUNTER — Ambulatory Visit (INDEPENDENT_AMBULATORY_CARE_PROVIDER_SITE_OTHER): Payer: Medicare Other | Admitting: Internal Medicine

## 2013-05-30 ENCOUNTER — Encounter: Payer: Self-pay | Admitting: Internal Medicine

## 2013-05-30 ENCOUNTER — Ambulatory Visit: Payer: Medicare Other | Admitting: Internal Medicine

## 2013-05-30 VITALS — BP 110/78 | HR 94 | Temp 97.6°F | Wt 149.0 lb

## 2013-05-30 DIAGNOSIS — N183 Chronic kidney disease, stage 3 unspecified: Secondary | ICD-10-CM

## 2013-05-30 DIAGNOSIS — R609 Edema, unspecified: Secondary | ICD-10-CM

## 2013-05-30 DIAGNOSIS — N189 Chronic kidney disease, unspecified: Secondary | ICD-10-CM | POA: Insufficient documentation

## 2013-05-30 DIAGNOSIS — S90416D Abrasion, unspecified lesser toe(s), subsequent encounter: Secondary | ICD-10-CM

## 2013-05-30 DIAGNOSIS — Z5189 Encounter for other specified aftercare: Secondary | ICD-10-CM

## 2013-05-30 DIAGNOSIS — D649 Anemia, unspecified: Secondary | ICD-10-CM

## 2013-05-30 DIAGNOSIS — I10 Essential (primary) hypertension: Secondary | ICD-10-CM

## 2013-05-30 LAB — CBC WITH DIFFERENTIAL/PLATELET
Basophils Absolute: 0 10*3/uL (ref 0.0–0.1)
Basophils Relative: 0.2 % (ref 0.0–3.0)
Eosinophils Absolute: 0.4 10*3/uL (ref 0.0–0.7)
Eosinophils Relative: 5.2 % — ABNORMAL HIGH (ref 0.0–5.0)
HCT: 39.5 % (ref 39.0–52.0)
Hemoglobin: 13.4 g/dL (ref 13.0–17.0)
Lymphocytes Relative: 37.7 % (ref 12.0–46.0)
Lymphs Abs: 2.8 10*3/uL (ref 0.7–4.0)
MCHC: 34 g/dL (ref 30.0–36.0)
MCV: 94.3 fl (ref 78.0–100.0)
Monocytes Absolute: 0.7 10*3/uL (ref 0.1–1.0)
Monocytes Relative: 9.6 % (ref 3.0–12.0)
Neutro Abs: 3.5 10*3/uL (ref 1.4–7.7)
Neutrophils Relative %: 47.3 % (ref 43.0–77.0)
Platelets: 216 10*3/uL (ref 150.0–400.0)
RBC: 4.2 Mil/uL — ABNORMAL LOW (ref 4.22–5.81)
RDW: 13.2 % (ref 11.5–14.6)
WBC: 7.5 10*3/uL (ref 4.5–10.5)

## 2013-05-30 LAB — COMPREHENSIVE METABOLIC PANEL
ALT: 19 U/L (ref 0–53)
Alkaline Phosphatase: 83 U/L (ref 39–117)
CO2: 25 mEq/L (ref 19–32)
Sodium: 138 mEq/L (ref 135–145)
Total Bilirubin: 0.4 mg/dL (ref 0.3–1.2)
Total Protein: 6.8 g/dL (ref 6.0–8.3)

## 2013-05-30 NOTE — Assessment & Plan Note (Signed)
Symptoms well controlled with use of compression stockings. Will continue.

## 2013-05-30 NOTE — Assessment & Plan Note (Signed)
BP Readings from Last 3 Encounters:  05/30/13 110/78  03/14/13 120/80  01/24/13 100/62   Blood pressure well-controlled on current medication. Will continue.

## 2013-05-30 NOTE — Assessment & Plan Note (Signed)
Chronic kidney disease stable based on labs today. We'll continue to monitor.

## 2013-05-30 NOTE — Progress Notes (Signed)
Subjective:    Patient ID: Travis [name], male    DOB: 10/05/1916, 79 y.o.   MRN: [id number]  HPI 79 year old male presents for followup. He reports he is generally feeling well. He notes some chronic fatigue which is unchanged. He reports good appetite. He denies any chest pain, shortness of breath, palpitations. He is compliant with medication. No new concerns today.   Outpatient Encounter Prescriptions as of 05/30/2013  Medication Sig Dispense Refill  . aspirin 81 MG EC tablet Take 81 mg by mouth daily.        Marland Kitchen atorvastatin (LIPITOR) 80 MG tablet Take 1 tablet (80 mg total) by mouth daily. Taking 1/2 tablet  30 tablet  5  . carbamide peroxide (DEBROX) 6.5 % otic solution Place 5 drops into both ears 2 (two) times daily.  15 mL  0  . carvedilol (COREG) 3.125 MG tablet Take 1 tablet (3.125 mg total) by mouth 2 (two) times daily.  60 tablet  11  . dabigatran (PRADAXA) 75 MG CAPS Take 1 capsule (75 mg total) by mouth every 12 (twelve) hours.  60 capsule  3  . docusate sodium (COLACE) 100 MG capsule Take 100 mg by mouth 2 (two) times daily.        Marland Kitchen dutasteride (AVODART) 0.5 MG capsule Take 0.5 mg by mouth. Take on Tuesday, Thursday, and Saturday.       . nystatin-triamcinolone ointment (MYCOLOG) Apply topically 2 (two) times daily. Apply topically 2 (two) times daily.  30 g  0  . omeprazole (PRILOSEC) 20 MG capsule Take 1 capsule (20 mg total) by mouth daily.  30 capsule  6  . solifenacin (VESICARE) 5 MG tablet Take 1 tablet (5 mg total) by mouth daily.  30 tablet  0   No facility-administered encounter medications on file as of 05/30/2013.   BP 110/78  Pulse 94  Temp(Src) 97.6 F (36.4 C) (Oral)  Wt 149 lb (67.586 kg)  BMI 21.38 kg/m2  SpO2 98%  Review of Systems  Constitutional: Negative for fever, chills, activity change, appetite change, fatigue and unexpected weight change.  HENT: Positive for hearing loss.   Eyes: Negative for visual disturbance.  Respiratory: Negative for  cough and shortness of breath.   Cardiovascular: Negative for chest pain, palpitations and leg swelling.  Gastrointestinal: Negative for abdominal pain and abdominal distention.  Genitourinary: Negative for dysuria, urgency and difficulty urinating.  Musculoskeletal: Negative for arthralgias and gait problem.  Skin: Negative for color change and rash.  Hematological: Negative for adenopathy.  Psychiatric/Behavioral: Negative for sleep disturbance and dysphoric mood. The patient is not nervous/anxious.        Objective:   Physical Exam  Constitutional: He is oriented to person, place, and time. He appears well-developed and well-nourished. No distress.  HENT:  Head: Normocephalic and atraumatic.  Right Ear: External ear normal.  Left Ear: External ear normal.  Nose: Nose normal.  Mouth/Throat: Oropharynx is clear and moist. No oropharyngeal exudate.  Eyes: Conjunctivae and EOM are normal. Pupils are equal, round, and reactive to light. Right eye exhibits no discharge. Left eye exhibits no discharge. No scleral icterus.  Neck: Normal range of motion. Neck supple. No tracheal deviation present. No thyromegaly present.  Cardiovascular: Normal rate, regular rhythm and normal heart sounds.  Exam reveals no gallop and no friction rub.   No murmur heard. Pulmonary/Chest: Effort normal and breath sounds normal. No respiratory distress. He has no wheezes. He has no rales. He exhibits no tenderness.  Musculoskeletal:  Normal range of motion. He exhibits no edema.  Lymphadenopathy:    He has no cervical adenopathy.  Neurological: He is alert and oriented to person, place, and time. No cranial nerve deficit. Coordination normal.  Skin: Skin is warm and dry. No rash noted. He is not diaphoretic. No erythema. No pallor.  Psychiatric: He has a normal mood and affect. His behavior is normal. Judgment and thought content normal.          Assessment & Plan:

## 2013-05-30 NOTE — Assessment & Plan Note (Signed)
CBC normal today. Will continue to monitor.

## 2013-06-06 ENCOUNTER — Ambulatory Visit: Payer: Medicare Other | Admitting: Internal Medicine

## 2013-06-16 ENCOUNTER — Ambulatory Visit: Payer: Medicare Other | Admitting: Internal Medicine

## 2013-08-25 ENCOUNTER — Other Ambulatory Visit: Payer: Self-pay | Admitting: *Deleted

## 2013-08-25 MED ORDER — DABIGATRAN ETEXILATE MESYLATE 75 MG PO CAPS: 75.0000 mg | ORAL_CAPSULE | Freq: Two times a day (BID) | ORAL | Status: DC

## 2013-08-25 NOTE — Telephone Encounter (Signed)
Refilled Pradaxa sent to [facility] pharmacy.

## 2013-09-10 ENCOUNTER — Encounter: Payer: Self-pay | Admitting: Cardiovascular Disease

## 2013-09-10 ENCOUNTER — Ambulatory Visit (INDEPENDENT_AMBULATORY_CARE_PROVIDER_SITE_OTHER): Payer: Medicare Other | Admitting: Cardiovascular Disease

## 2013-09-10 VITALS — BP 108/64 | HR 81 | Ht 70.0 in | Wt 145.8 lb

## 2013-09-10 DIAGNOSIS — N189 Chronic kidney disease, unspecified: Secondary | ICD-10-CM

## 2013-09-10 DIAGNOSIS — I951 Orthostatic hypotension: Secondary | ICD-10-CM

## 2013-09-10 DIAGNOSIS — I25810 Atherosclerosis of coronary artery bypass graft(s) without angina pectoris: Secondary | ICD-10-CM

## 2013-09-10 DIAGNOSIS — E785 Hyperlipidemia, unspecified: Secondary | ICD-10-CM

## 2013-09-10 NOTE — Assessment & Plan Note (Signed)
Uncertain what part of his elevated creatinine and BUN is secondary to mild dehydration. Blood pressure borderline low. Encouraged him to increase his fluid intake.

## 2013-09-10 NOTE — Progress Notes (Signed)
Patient ID: Travis [name], male    DOB: 1916/10/22, 79 y.o.   MRN: [id number]  HPI Comments: Travis [name] is a  79 year old male with a history of coronary artery disease, status post non-ST-elevation myocardial infarction in May 2006, CABG, history of sleep apnea, mild to moderate aortic insufficiency,  and a history of pulmonary embolus in the setting of fall in 2005,   renal insufficiency (poor fluid intake) with baseline creatinine 1.9, episode of left lower extremity edema, presented to the hospital December 24 with ultrasound showing near complete occlusive thrombus of the left lower extremity extending from the common femoral vein through the superficial femoral vein to the popliteal vein. He was started on warfarin in the hospital and discharged. Shortly after, he developed what was felt to be blue toe Syndrome and he was changed to pradaxa 75 mg b.i.d..  He was found to be factor V Leiden positive  Edema has essentially resolved, now denies having any significant ankle swelling. He reports that he drinks water and orange juice during the daytime, unclear how much. Denies any dizziness or lightheadedness particularly with standing.  He lives by himself and 3 daughters help to take care of him, visiting occasionally.  he does stay active. Previous fall, none recently. 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 Doing better with recent hearing aid changes.  Blood pressure typically runs low. Previously encouraged to drink fluids, increase salt intake. Has not been recently monitoring his blood pressure. Previously started on prostate medication by Dr. Lonna Cobb  ECHO 4/11 EF 50-55% mild to moderate AI, mild MR.     EKG shows normal sinus rhythm with rate 81 beats per minute with right bundle branch block, APCs in a bigeminal pattern        Outpatient Encounter Prescriptions as of 09/10/2013  Medication Sig Dispense Refill  . aspirin 81 MG EC tablet Take 81 mg by mouth  daily.        Marland Kitchen atorvastatin (LIPITOR) 80 MG tablet Take 1 tablet (80 mg total) by mouth daily. Taking 1/2 tablet  30 tablet  5  . carbamide peroxide (DEBROX) 6.5 % otic solution Place 5 drops into both ears 2 (two) times daily.  15 mL  0  . carvedilol (COREG) 3.125 MG tablet Take 1 tablet (3.125 mg total) by mouth 2 (two) times daily.  60 tablet  11  . dabigatran (PRADAXA) 75 MG CAPS capsule Take 1 capsule (75 mg total) by mouth every 12 (twelve) hours.  60 capsule  3  . docusate sodium (COLACE) 100 MG capsule Take 100 mg by mouth 2 (two) times daily.        Marland Kitchen dutasteride (AVODART) 0.5 MG capsule Take 0.5 mg by mouth. Take on Tuesday, Thursday, and Saturday.       . nystatin-triamcinolone ointment (MYCOLOG) Apply topically 2 (two) times daily. Apply topically 2 (two) times daily.  30 g  0  . omeprazole (PRILOSEC) 20 MG capsule Take 1 capsule (20 mg total) by mouth daily.  30 capsule  6  . solifenacin (VESICARE) 5 MG tablet Take 1 tablet (5 mg total) by mouth daily.  30 tablet  0   No facility-administered encounter medications on file as of 09/10/2013.    Review of Systems  Constitutional: Negative.   HENT: Positive for hearing loss.   Eyes: Negative.   Respiratory: Negative.   Gastrointestinal: Negative.   Endocrine: Negative.   Musculoskeletal: Positive for gait problem.  Skin:  Negative.   Allergic/Immunologic: Negative.   Neurological: Negative.   Hematological: Negative.   Psychiatric/Behavioral: Negative.   All other systems reviewed and are negative.    BP 108/64  Pulse 81  Ht 5\' 10"  (1.778 m)  Wt 145 lb 12 oz (66.112 kg)  BMI 20.91 kg/m2  Physical Exam  Nursing note and vitals reviewed. Constitutional: He is oriented to person, place, and time. He appears well-developed and well-nourished.  HENT:  Head: Normocephalic.  Nose: Nose normal.  Mouth/Throat: Oropharynx is clear and moist.  Eyes: Conjunctivae are normal. Pupils are equal, round, and reactive to light.   Neck: Normal range of motion. Neck supple. No JVD present.  Cardiovascular: Normal rate, regular rhythm, S1 normal, S2 normal and intact distal pulses.  Exam reveals no gallop and no friction rub.   Murmur heard.  Crescendo systolic murmur is present with a grade of 2/6  Pulmonary/Chest: Effort normal and breath sounds normal. No respiratory distress. He has no wheezes. He has no rales. He exhibits no tenderness.  Abdominal: Soft. Bowel sounds are normal. He exhibits no distension. There is no tenderness.  Musculoskeletal: Normal range of motion. He exhibits no edema and no tenderness.  Lymphadenopathy:    He has no cervical adenopathy.  Neurological: He is alert and oriented to person, place, and time. Coordination normal.  Skin: Skin is warm and dry. No rash noted. No erythema.  Psychiatric: He has a normal mood and affect. His behavior is normal. Judgment and thought content normal.      Assessment and Plan

## 2013-09-10 NOTE — Assessment & Plan Note (Signed)
We have asked him to watch his blood pressure at home. If numbers run low, we would decrease his Coreg down to 1 per day.

## 2013-09-10 NOTE — Assessment & Plan Note (Signed)
Cholesterol is at goal on the current lipid regimen. No changes to the medications were made.  

## 2013-09-10 NOTE — Patient Instructions (Addendum)
You are doing well. No medication changes were made.  Please continue high fluid intake Monitor blood pressure at home Pleas call or bring to the office your blood pressure numbers  Please call us if you have new issues that need to be addressed before your next appt.  Your physician wants you to follow-up in: 6 months.  You will receive a reminder letter in the mail two months in advance. If you don't receive a letter, please call our office to schedule the follow-up appointment.

## 2013-09-10 NOTE — Assessment & Plan Note (Signed)
Currently with no symptoms of angina. No further workup at this time. Continue current medication regimen. 

## 2013-09-16 ENCOUNTER — Other Ambulatory Visit: Payer: Self-pay | Admitting: *Deleted

## 2013-09-16 DIAGNOSIS — I2510 Atherosclerotic heart disease of native coronary artery without angina pectoris: Secondary | ICD-10-CM

## 2013-09-16 MED ORDER — CARVEDILOL 3.125 MG PO TABS: 3.1250 mg | ORAL_TABLET | Freq: Two times a day (BID) | ORAL | Status: DC

## 2013-09-16 NOTE — Telephone Encounter (Signed)
Refilled Carvedilol sent to [facility].

## 2013-10-13 ENCOUNTER — Other Ambulatory Visit: Payer: Self-pay | Admitting: *Deleted

## 2013-10-13 DIAGNOSIS — I2510 Atherosclerotic heart disease of native coronary artery without angina pectoris: Secondary | ICD-10-CM

## 2013-10-13 MED ORDER — CARVEDILOL 3.125 MG PO TABS: 3.1250 mg | ORAL_TABLET | Freq: Two times a day (BID) | ORAL | Status: DC

## 2013-10-13 MED ORDER — ATORVASTATIN CALCIUM 80 MG PO TABS: 80.0000 mg | ORAL_TABLET | Freq: Every day | ORAL | Status: DC

## 2013-10-13 NOTE — Telephone Encounter (Signed)
Requested Prescriptions   Signed Prescriptions Disp Refills  . atorvastatin (LIPITOR) 80 MG tablet 30 tablet 5    Sig: Take 1 tablet (80 mg total) by mouth daily. Taking 1/2 tablet    Authorizing Provider: Antonieta Iba    Ordering User: Shawnie Dapper, MARINA C  . carvedilol (COREG) 3.125 MG tablet 60 tablet 3    Sig: Take 1 tablet (3.125 mg total) by mouth 2 (two) times daily.    Authorizing Provider: Antonieta Iba    Ordering User: Kendrick Fries

## 2013-10-23 ENCOUNTER — Other Ambulatory Visit: Payer: Self-pay

## 2013-11-14 ENCOUNTER — Encounter: Payer: Self-pay | Admitting: Cardiovascular Disease

## 2013-12-02 ENCOUNTER — Other Ambulatory Visit: Payer: Self-pay

## 2013-12-02 MED ORDER — OMEPRAZOLE 20 MG PO CPDR: 20.0000 mg | DELAYED_RELEASE_CAPSULE | Freq: Every day | ORAL | Status: DC

## 2013-12-02 NOTE — Telephone Encounter (Signed)
Refill sent for omeprazole 20 mg take one tablet daily.

## 2013-12-12 ENCOUNTER — Telehealth: Payer: Self-pay | Admitting: Internal Medicine

## 2013-12-12 NOTE — Telephone Encounter (Signed)
Unfortunately, schedule is full Monday. If vomiting, needs to be seen at urgent care or at our [facility]. Could he come Monday Jan 5th at 1pm?

## 2013-12-12 NOTE — Telephone Encounter (Signed)
Fwd to Dr. Walker, please advise 

## 2013-12-12 NOTE — Telephone Encounter (Signed)
Spoke with Lynden Ang, informed her no openings for Monday and he could be taken to urgent care or Saturday [facility]. Declined [facility] but stated it was nothing emergent and patient only threw up one time but since they were here she was trying to squeeze in some appointments. She will call back Monday to get him in to be seen anyway just to be checked out and agrees to take patient to urgent care if he worsens.

## 2013-12-12 NOTE — Telephone Encounter (Signed)
States they are in town and pt is overdue for appt.  Would like to bring him in on Monday.  States last night after supper he vomited liquid for no apparent reason.

## 2014-01-02 ENCOUNTER — Other Ambulatory Visit: Payer: Self-pay | Admitting: *Deleted

## 2014-01-02 MED ORDER — DABIGATRAN ETEXILATE MESYLATE 75 MG PO CAPS: 75.0000 mg | ORAL_CAPSULE | Freq: Two times a day (BID) | ORAL | Status: DC

## 2014-01-02 NOTE — Telephone Encounter (Signed)
Requested Prescriptions   Signed Prescriptions Disp Refills  . dabigatran (PRADAXA) 75 MG CAPS capsule 60 capsule 3    Sig: Take 1 capsule (75 mg total) by mouth every 12 (twelve) hours.    Authorizing Provider: Antonieta IbaGOLLAN, TIMOTHY J    Ordering User: Kendrick FriesLOPEZ, Breona Cherubin C

## 2014-01-12 ENCOUNTER — Ambulatory Visit: Payer: Self-pay | Admitting: Internal Medicine

## 2014-02-23 ENCOUNTER — Ambulatory Visit (INDEPENDENT_AMBULATORY_CARE_PROVIDER_SITE_OTHER): Payer: Commercial Managed Care - HMO | Admitting: Internal Medicine

## 2014-02-23 ENCOUNTER — Encounter: Payer: Self-pay | Admitting: Internal Medicine

## 2014-02-23 VITALS — BP 110/70 | HR 81 | Temp 97.8°F | Wt 146.0 lb

## 2014-02-23 DIAGNOSIS — I10 Essential (primary) hypertension: Secondary | ICD-10-CM

## 2014-02-23 DIAGNOSIS — I75029 Atheroembolism of unspecified lower extremity: Secondary | ICD-10-CM

## 2014-02-23 DIAGNOSIS — L608 Other nail disorders: Secondary | ICD-10-CM | POA: Insufficient documentation

## 2014-02-23 DIAGNOSIS — I82409 Acute embolism and thrombosis of unspecified deep veins of unspecified lower extremity: Secondary | ICD-10-CM

## 2014-02-23 DIAGNOSIS — N189 Chronic kidney disease, unspecified: Secondary | ICD-10-CM

## 2014-02-23 DIAGNOSIS — L609 Nail disorder, unspecified: Secondary | ICD-10-CM

## 2014-02-23 DIAGNOSIS — Z23 Encounter for immunization: Secondary | ICD-10-CM

## 2014-02-23 LAB — COMPREHENSIVE METABOLIC PANEL
ALK PHOS: 73 U/L (ref 39–117)
ALT: 21 U/L (ref 0–53)
AST: 27 U/L (ref 0–37)
Albumin: 3.3 g/dL — ABNORMAL LOW (ref 3.5–5.2)
BILIRUBIN TOTAL: 0.6 mg/dL (ref 0.3–1.2)
BUN: 29 mg/dL — AB (ref 6–23)
CALCIUM: 8.4 mg/dL (ref 8.4–10.5)
CHLORIDE: 104 meq/L (ref 96–112)
CO2: 26 mEq/L (ref 19–32)
CREATININE: 1.9 mg/dL — AB (ref 0.4–1.5)
GFR: 34.53 mL/min — ABNORMAL LOW (ref >60.00)
Glucose, Bld: 108 mg/dL — ABNORMAL HIGH (ref 70–99)
Potassium: 4 mEq/L (ref 3.5–5.1)
Sodium: 137 mEq/L (ref 135–145)
Total Protein: 6.8 g/dL (ref 6.0–8.3)

## 2014-02-23 LAB — LIPID PANEL
CHOL/HDL RATIO: 3
Cholesterol: 142 mg/dL (ref 0–200)
HDL: 42.3 mg/dL (ref >39.00)
LDL CALC: 69 mg/dL (ref 0–99)
TRIGLYCERIDES: 152 mg/dL — AB (ref 0.0–149.0)
VLDL: 30.4 mg/dL (ref 0.0–40.0)

## 2014-02-23 LAB — CBC WITH DIFFERENTIAL/PLATELET
BASOS ABS: 0 10*3/uL (ref 0.0–0.1)
BASOS PCT: 0.5 % (ref 0.0–3.0)
EOS ABS: 0.3 10*3/uL (ref 0.0–0.7)
Eosinophils Relative: 3.8 % (ref 0.0–5.0)
HCT: 38.5 % — ABNORMAL LOW (ref 39.0–52.0)
Hemoglobin: 12.8 g/dL — ABNORMAL LOW (ref 13.0–17.0)
LYMPHS PCT: 26.8 % (ref 12.0–46.0)
Lymphs Abs: 2.2 10*3/uL (ref 0.7–4.0)
MCHC: 33.3 g/dL (ref 30.0–36.0)
MCV: 94.7 fl (ref 78.0–100.0)
Monocytes Absolute: 0.7 10*3/uL (ref 0.1–1.0)
Monocytes Relative: 8.4 % (ref 3.0–12.0)
NEUTROS PCT: 60.5 % (ref 43.0–77.0)
Neutro Abs: 4.9 10*3/uL (ref 1.4–7.7)
Platelets: 190 10*3/uL (ref 150.0–400.0)
RBC: 4.06 Mil/uL — AB (ref 4.22–5.81)
RDW: 13.3 % (ref 11.5–14.6)
WBC: 8 10*3/uL (ref 4.5–10.5)

## 2014-02-23 NOTE — Assessment & Plan Note (Signed)
Exam is stable compared to previous. Cyanosis noted, however pedal pulses intact.

## 2014-02-23 NOTE — Assessment & Plan Note (Signed)
Will check renal function with labs today. 

## 2014-02-23 NOTE — Progress Notes (Signed)
Subjective:    Patient ID: Travis [name], male    DOB: 10-14-1916, 80 y.o.   MRN: [id number]  HPI 80YO male with h/o hypertension and CKD presents with his daughter for follow up.  Feeling well. No concerns today. Denies pain, shortness of breath. Appetite good. Had flu shot this fall.  Outpatient Encounter Prescriptions as of 02/23/2014  Medication Sig  . aspirin 81 MG EC tablet Take 81 mg by mouth daily.    Marland Kitchen atorvastatin (LIPITOR) 80 MG tablet Take 1 tablet (80 mg total) by mouth daily. Taking 1/2 tablet  . carbamide peroxide (DEBROX) 6.5 % otic solution Place 5 drops into both ears 2 (two) times daily.  . carvedilol (COREG) 3.125 MG tablet Take 1 tablet (3.125 mg total) by mouth 2 (two) times daily.  . dabigatran (PRADAXA) 75 MG CAPS capsule Take 1 capsule (75 mg total) by mouth every 12 (twelve) hours.  . docusate sodium (COLACE) 100 MG capsule Take 100 mg by mouth 2 (two) times daily.    Marland Kitchen dutasteride (AVODART) 0.5 MG capsule Take 0.5 mg by mouth. Take on Tuesday, Thursday, and Saturday.   . nystatin-triamcinolone ointment (MYCOLOG) Apply topically 2 (two) times daily. Apply topically 2 (two) times daily.  Marland Kitchen omeprazole (PRILOSEC) 20 MG capsule Take 1 capsule (20 mg total) by mouth daily.  . solifenacin (VESICARE) 5 MG tablet Take 1 tablet (5 mg total) by mouth daily.    Review of Systems  Constitutional: Negative for fever, chills, activity change, appetite change, fatigue and unexpected weight change.  Eyes: Negative for visual disturbance.  Respiratory: Negative for cough and shortness of breath.   Cardiovascular: Negative for chest pain, palpitations and leg swelling.  Gastrointestinal: Negative for abdominal pain and abdominal distention.  Genitourinary: Negative for dysuria, urgency and difficulty urinating.  Musculoskeletal: Negative for arthralgias and gait problem.  Skin: Negative for color change and rash.  Hematological: Negative for adenopathy.    Psychiatric/Behavioral: Negative for sleep disturbance and dysphoric mood. The patient is not nervous/anxious.        Objective:    BP 110/70  Pulse 81  Temp(Src) 97.8 F (36.6 C) (Oral)  Wt 146 lb (66.225 kg)  SpO2 98% Physical Exam  Constitutional: He is oriented to person, place, and time. He appears well-developed and well-nourished. No distress.  HENT:  Head: Normocephalic and atraumatic.  Right Ear: External ear normal.  Left Ear: External ear normal.  Nose: Nose normal.  Mouth/Throat: Oropharynx is clear and moist. No oropharyngeal exudate.  Eyes: Conjunctivae and EOM are normal. Pupils are equal, round, and reactive to light. Right eye exhibits no discharge. Left eye exhibits no discharge. No scleral icterus.  Neck: Normal range of motion. Neck supple. No tracheal deviation present. No thyromegaly present.  Cardiovascular: Normal rate, regular rhythm and normal heart sounds.  Exam reveals no gallop and no friction rub.   No murmur heard. Pulmonary/Chest: Effort normal and breath sounds normal. No accessory muscle usage. Not tachypneic. No respiratory distress. He has no decreased breath sounds. He has no wheezes. He has no rhonchi. He has no rales. He exhibits no tenderness.  Musculoskeletal: Normal range of motion. He exhibits no edema.  Lymphadenopathy:    He has no cervical adenopathy.  Neurological: He is alert and oriented to person, place, and time. No cranial nerve deficit. Coordination normal.  Skin: Skin is warm and dry. No rash noted. He is not diaphoretic. There is cyanosis. No erythema. No pallor.     Psychiatric:  He has a normal mood and affect. His behavior is normal. Judgment and thought content normal.          Assessment & Plan:   Problem List Items Addressed This Visit   Blue toe syndrome     Exam is stable compared to previous. Cyanosis noted, however pedal pulses intact.    Chronic kidney disease     Will check renal function with labs  today.    Relevant Orders      CBC w/Diff   DVT (deep venous thrombosis)     Continues on Pradaxa. No recent issues with bleeding or bruising. Will continue to monitor.    Hypertension - Primary      BP Readings from Last 3 Encounters:  02/23/14 110/70  09/10/13 108/64  05/30/13 110/78   BP well controlled on current medications. Will check renal function with labs.    Relevant Orders      Comp Met (CMET)      Lipid Profile   Toenail deformity     Will set up podiatry evaluation for toenail clipping.    Relevant Orders      Ambulatory referral to Podiatry       Return in about 3 months (around 05/26/2014) for Recheck.

## 2014-02-23 NOTE — Assessment & Plan Note (Signed)
Continues on Pradaxa. No recent issues with bleeding or bruising. Will continue to monitor.

## 2014-02-23 NOTE — Assessment & Plan Note (Signed)
BP Readings from Last 3 Encounters:  02/23/14 110/70  09/10/13 108/64  05/30/13 110/78   BP well controlled on current medications. Will check renal function with labs.

## 2014-02-23 NOTE — Progress Notes (Signed)
Pre visit review using our clinic review tool, if applicable. No additional management support is needed unless otherwise documented below in the visit note. 

## 2014-02-23 NOTE — Assessment & Plan Note (Signed)
Will set up podiatry evaluation for toenail clipping.

## 2014-02-24 ENCOUNTER — Telehealth: Payer: Self-pay | Admitting: Internal Medicine

## 2014-02-24 NOTE — Telephone Encounter (Signed)
Relevant patient education assigned to patient using Emmi. ° °

## 2014-03-02 ENCOUNTER — Other Ambulatory Visit: Payer: Self-pay | Admitting: *Deleted

## 2014-03-02 DIAGNOSIS — I2510 Atherosclerotic heart disease of native coronary artery without angina pectoris: Secondary | ICD-10-CM

## 2014-03-02 MED ORDER — CARVEDILOL 3.125 MG PO TABS: 3.1250 mg | ORAL_TABLET | Freq: Two times a day (BID) | ORAL | Status: DC

## 2014-03-02 NOTE — Telephone Encounter (Signed)
Requested Prescriptions   Signed Prescriptions Disp Refills  . carvedilol (COREG) 3.125 MG tablet 60 tablet 3    Sig: Take 1 tablet (3.125 mg total) by mouth 2 (two) times daily.    Authorizing Provider: Antonieta IbaGOLLAN, TIMOTHY J    Ordering User: Kendrick FriesLOPEZ, MARINA C

## 2014-03-06 ENCOUNTER — Ambulatory Visit: Payer: Self-pay | Admitting: Podiatry

## 2014-04-14 ENCOUNTER — Telehealth: Payer: Self-pay | Admitting: Internal Medicine

## 2014-04-14 NOTE — Telephone Encounter (Signed)
Daughter called states patient has an appointment with Dr. Irving ShowsEgerton at [facility] on 04/16/14. She was calling to see if this required a referral since patient has [employer]. Please call and advise daughter if this does need a referral or not.

## 2014-04-14 NOTE — Telephone Encounter (Signed)
I called Shelly at [facility] and she states they aren't being required at this time. I will call patient's daughter and let her know.

## 2014-04-14 NOTE — Telephone Encounter (Signed)
I called daughter to inform her that no referral was needed at this time.

## 2014-04-16 ENCOUNTER — Ambulatory Visit (INDEPENDENT_AMBULATORY_CARE_PROVIDER_SITE_OTHER): Payer: Commercial Managed Care - HMO | Admitting: Podiatrist

## 2014-04-16 ENCOUNTER — Encounter: Payer: Self-pay | Admitting: Podiatrist

## 2014-04-16 VITALS — Resp 16 | Ht 64.0 in | Wt 145.0 lb

## 2014-04-16 DIAGNOSIS — M79609 Pain in unspecified limb: Secondary | ICD-10-CM

## 2014-04-16 DIAGNOSIS — B351 Tinea unguium: Secondary | ICD-10-CM

## 2014-04-16 NOTE — Patient Instructions (Signed)

## 2014-04-16 NOTE — Progress Notes (Signed)
   HPI: Patient is [age] y.o. male who presents today for foot and nail care.  In the past he has developed DVT's and was on coumadin which caused him to develop purple discoloration of his feet.  He is now on Pradaxa.  He has factor V leiden deficiency.  Overall doing well.  He see's Dr. Thom ChimesSheir for his vascular care and follow ups     Physical Exam GENERAL APPEARANCE: Alert, hard of hearing. Appropriately groomed. No acute distress.  VASCULAR: Pedal pulses palpable at 2/4 DP and PT bilateral.  Purplish discoloration is present to bilateral feet.   NEUROLOGIC: sensation is intact epicritically and protectively to 5.07 monofilament at 5/5 sites bilateral.  Light touch is intact bilateral, vibratory sensation intact bilateral, achilles tendon reflex is intact bilateral.  MUSCULOSKELETAL: acceptable muscle strength, tone and stability bilateral.  Intrinsic muscluature intact bilateral.  Rectus appearance of foot and digits noted bilateral.   DERMATOLOGIC: toenails are thick, discolored, dystrophic, yellow brown discoloration with mycotic infection present.  No preulcerative lesions are seen, no interdigital maceration noted.  No open lesions present.   Assessment: symptomatic mycotic toenails; on blood thinner  Plan:carefully debridement of nails was carried out without complication. He will return in 3 months or as needed for follow up

## 2014-05-08 ENCOUNTER — Ambulatory Visit: Payer: Commercial Managed Care - HMO | Admitting: Cardiovascular Disease

## 2014-06-05 ENCOUNTER — Observation Stay: Payer: Self-pay | Admitting: Specialist

## 2014-06-05 ENCOUNTER — Ambulatory Visit: Payer: Commercial Managed Care - HMO | Admitting: Cardiovascular Disease

## 2014-06-05 ENCOUNTER — Ambulatory Visit: Payer: Commercial Managed Care - HMO | Admitting: Internal Medicine

## 2014-06-05 LAB — URINALYSIS, COMPLETE
BILIRUBIN, UR: NEGATIVE
Bacteria: NONE SEEN
Blood: NEGATIVE
Glucose,UR: NEGATIVE mg/dL (ref 0–75)
Ketone: NEGATIVE
Leukocyte Esterase: NEGATIVE
Nitrite: NEGATIVE
PH: 5 (ref 4.5–8.0)
Protein: NEGATIVE
RBC, UR: NONE SEEN /HPF (ref 0–5)
Specific Gravity: 1.023 (ref 1.003–1.030)
Squamous Epithelial: NONE SEEN
WBC UR: <1 /HPF (ref 0–5)

## 2014-06-05 LAB — COMPREHENSIVE METABOLIC PANEL
ALBUMIN: 3 g/dL — AB (ref 3.4–5.0)
ALK PHOS: 79 U/L
ANION GAP: 7 (ref 7–16)
AST: 26 U/L (ref 15–37)
BUN: 37 mg/dL — ABNORMAL HIGH (ref 7–18)
Bilirubin,Total: 0.7 mg/dL (ref 0.2–1.0)
CALCIUM: 8.1 mg/dL — AB (ref 8.5–10.1)
Chloride: 104 mmol/L (ref 98–107)
Co2: 24 mmol/L (ref 21–32)
Creatinine: 2.08 mg/dL — ABNORMAL HIGH (ref 0.60–1.30)
GFR CALC AF AMER: 30 — AB
GFR CALC NON AF AMER: 26 — AB
GLUCOSE: 86 mg/dL (ref 65–99)
OSMOLALITY: 278 (ref 275–301)
Potassium: 3.7 mmol/L (ref 3.5–5.1)
SGPT (ALT): 21 U/L (ref 12–78)
Sodium: 135 mmol/L — ABNORMAL LOW (ref 136–145)
TOTAL PROTEIN: 6.7 g/dL (ref 6.4–8.2)

## 2014-06-05 LAB — CBC WITH DIFFERENTIAL/PLATELET
BASOS ABS: 0.1 10*3/uL (ref 0.0–0.1)
Basophil %: 0.6 %
EOS ABS: 0.4 10*3/uL (ref 0.0–0.7)
EOS PCT: 3.4 %
HCT: 38.5 % — ABNORMAL LOW (ref 40.0–52.0)
HGB: 13 g/dL (ref 13.0–18.0)
LYMPHS ABS: 2.2 10*3/uL (ref 1.0–3.6)
LYMPHS PCT: 17.9 %
MCH: 31.9 pg (ref 26.0–34.0)
MCHC: 33.8 g/dL (ref 32.0–36.0)
MCV: 95 fL (ref 80–100)
MONOS PCT: 7.7 %
Monocyte #: 1 x10 3/mm (ref 0.2–1.0)
Neutrophil #: 8.7 10*3/uL — ABNORMAL HIGH (ref 1.4–6.5)
Neutrophil %: 70.4 %
Platelet: 165 10*3/uL (ref 150–440)
RBC: 4.08 10*6/uL — ABNORMAL LOW (ref 4.40–5.90)
RDW: 12.9 % (ref 11.5–14.5)
WBC: 12.4 10*3/uL — ABNORMAL HIGH (ref 3.8–10.6)

## 2014-06-05 LAB — TROPONIN I

## 2014-06-06 LAB — BASIC METABOLIC PANEL
ANION GAP: 6 — AB (ref 7–16)
BUN: 31 mg/dL — ABNORMAL HIGH (ref 7–18)
CHLORIDE: 109 mmol/L — AB (ref 98–107)
CREATININE: 1.76 mg/dL — AB (ref 0.60–1.30)
Calcium, Total: 7.3 mg/dL — ABNORMAL LOW (ref 8.5–10.1)
Co2: 24 mmol/L (ref 21–32)
EGFR (African American): 37 — ABNORMAL LOW
GFR CALC NON AF AMER: 32 — AB
Glucose: 89 mg/dL (ref 65–99)
Osmolality: 284 (ref 275–301)
Potassium: 3.3 mmol/L — ABNORMAL LOW (ref 3.5–5.1)
Sodium: 139 mmol/L (ref 136–145)

## 2014-06-08 ENCOUNTER — Ambulatory Visit (INDEPENDENT_AMBULATORY_CARE_PROVIDER_SITE_OTHER): Payer: Commercial Managed Care - HMO | Admitting: Cardiovascular Disease

## 2014-06-08 ENCOUNTER — Encounter: Payer: Self-pay | Admitting: Cardiovascular Disease

## 2014-06-08 VITALS — BP 80/54 | HR 88 | Ht 67.0 in | Wt 146.5 lb

## 2014-06-08 DIAGNOSIS — D6859 Other primary thrombophilia: Secondary | ICD-10-CM

## 2014-06-08 DIAGNOSIS — D6851 Activated protein C resistance: Secondary | ICD-10-CM | POA: Insufficient documentation

## 2014-06-08 DIAGNOSIS — N183 Chronic kidney disease, stage 3 unspecified: Secondary | ICD-10-CM

## 2014-06-08 DIAGNOSIS — I951 Orthostatic hypotension: Secondary | ICD-10-CM

## 2014-06-08 DIAGNOSIS — I2510 Atherosclerotic heart disease of native coronary artery without angina pectoris: Secondary | ICD-10-CM

## 2014-06-08 DIAGNOSIS — I25810 Atherosclerosis of coronary artery bypass graft(s) without angina pectoris: Secondary | ICD-10-CM

## 2014-06-08 DIAGNOSIS — I82409 Acute embolism and thrombosis of unspecified deep veins of unspecified lower extremity: Secondary | ICD-10-CM

## 2014-06-08 MED ORDER — APIXABAN 5 MG PO TABS: 5.0000 mg | ORAL_TABLET | Freq: Two times a day (BID) | ORAL | Status: DC

## 2014-06-08 NOTE — Assessment & Plan Note (Signed)
He we'll continue him on anticoagulation as above. Stop his aspirin

## 2014-06-08 NOTE — Assessment & Plan Note (Signed)
Recently received IV fluids. Interestingly no dramatic improvement of his renal function. This suggests chronic baseline renal dysfunction

## 2014-06-08 NOTE — Progress Notes (Signed)
Patient ID: Travis [name], male    DOB: [birth year], 80 y.o.   MRN: [id number]  HPI Comments: Travis [name] is a  80 year old male with a history of coronary artery disease, status post non-ST-elevation myocardial infarction in May 2006, CABG, history of sleep apnea, mild to moderate aortic insufficiency,  and a history of pulmonary embolus in the setting of fall in 2005,   renal insufficiency (poor fluid intake) with baseline creatinine 1.9, episode of left lower extremity edema, presented to the hospital December 11 2011 with ultrasound showing near complete occlusive thrombus of the left lower extremity extending from the common femoral vein through the superficial femoral vein to the popliteal vein. He was started on warfarin in the hospital and discharged. Shortly after, he developed what was felt to be blue toe Syndrome and he was changed to pradaxa 75 mg b.i.d..  He was found to be factor V Leiden positive  In followup today, his daughter reports it is not taking care of himself at home. He was found to be in his house him a temperature 87, possibly dehydrated. He was sent to the hospital for IV fluids. He is relatively asymptomatic, discharged home the next day. Continue to have orthostatic hypotension.  He denies having any dizziness or lightheadedness. Daughter reports that he is very tired, sleeps much of the day. Able to take care of himself and continues to live alone with visiting nurses now. Daughter reports no recent falls  Pradaxa was held in the hospital. recommendation was made to consider  Eliquis given his renal dysfunction. He continues to take low-dose carvedilol  ECHO 4/11 EF 50-55% mild to moderate AI, mild MR.     EKG shows normal sinus rhythm with rate 88 beats per minute with right bundle branch block Orthostatics in the office today shows supine blood pressure 100/58, 90/52 sitting, 70/50 standing. Heart rate up from 82-100        Outpatient Encounter Prescriptions as  of 06/08/2014  Medication Sig  . aspirin 81 MG EC tablet Take 81 mg by mouth daily.   Marland Kitchen. atorvastatin (LIPITOR) 80 MG tablet Take 40 mg by mouth daily.  . carvedilol (COREG) 3.125 MG tablet Take 1 tablet (3.125 mg total) by mouth 2 (two) times daily.  . dabigatran (PRADAXA) 75 MG CAPS capsule Take 1 capsule (75 mg total) by mouth every 12 (twelve) hours.  . docusate sodium (COLACE) 100 MG capsule Take 100 mg by mouth 2 (two) times daily.    Marland Kitchen. dutasteride (AVODART) 0.5 MG capsule Take 0.5 mg by mouth. Take on Tuesday, Thursday, and Saturday.   . nystatin-triamcinolone ointment (MYCOLOG) Apply topically 2 (two) times daily. Apply topically 2 (two) times daily.  Marland Kitchen. omeprazole (PRILOSEC) 20 MG capsule Take 1 capsule (20 mg total) by mouth daily.  . solifenacin (VESICARE) 5 MG tablet Take 1 tablet (5 mg total) by mouth daily.   Review of Systems  Constitutional: Negative.   HENT: Positive for hearing loss.   Eyes: Negative.   Respiratory: Negative.   Cardiovascular: Negative.   Gastrointestinal: Negative.   Endocrine: Negative.   Musculoskeletal: Positive for gait problem.  Skin: Negative.   Allergic/Immunologic: Negative.   Neurological: Negative.   Hematological: Negative.   Psychiatric/Behavioral: Negative.   All other systems reviewed and are negative.   BP 80/54  Pulse 88  Ht 5\' 7"  (1.702 m)  Wt 146 lb 8 oz (66.452 kg)  BMI 22.94 kg/m2  Physical Exam  Nursing note and vitals reviewed.  Constitutional: He is oriented to person, place, and time. He appears well-developed and well-nourished.  HENT:  Head: Normocephalic.  Nose: Nose normal.  Mouth/Throat: Oropharynx is clear and moist.  Eyes: Conjunctivae are normal. Pupils are equal, round, and reactive to light.  Neck: Normal range of motion. Neck supple. No JVD present.  Cardiovascular: Normal rate, regular rhythm, S1 normal, S2 normal and intact distal pulses.  Exam reveals no gallop and no friction rub.   Murmur heard.   Crescendo systolic murmur is present with a grade of 2/6  Pulmonary/Chest: Effort normal and breath sounds normal. No respiratory distress. He has no wheezes. He has no rales. He exhibits no tenderness.  Abdominal: Soft. Bowel sounds are normal. He exhibits no distension. There is no tenderness.  Musculoskeletal: Normal range of motion. He exhibits no edema and no tenderness.  Lymphadenopathy:    He has no cervical adenopathy.  Neurological: He is alert and oriented to person, place, and time. Coordination normal.  Skin: Skin is warm and dry. No rash noted. No erythema.  Psychiatric: He has a normal mood and affect. His behavior is normal. Judgment and thought content normal.      Assessment and Plan

## 2014-06-08 NOTE — Assessment & Plan Note (Signed)
History of prior DVT which was extensive. Leiden factor V positive. We will restart him on eliquis 2.5 mg twice a day. 5 mg pills have been provided with samples and prescription to make them cost effective. Family will cut these pills in half

## 2014-06-08 NOTE — Patient Instructions (Addendum)
Please hold the pradaxa Please start start eliquis one pill twice a day (1/2 of the 5 mg pill twice a day) Stop the aspirin  Please stop the coreg  Monitor the blood pressure with standing If it drops low, call the office  Please call us if you have new issues that need to be addressed before your next appt.  Your physician wants you to follow-up in: 2 month.

## 2014-06-08 NOTE — Assessment & Plan Note (Signed)
Currently with no symptoms of angina. No further workup at this time. Continue current medication regimen. 

## 2014-06-08 NOTE — Assessment & Plan Note (Signed)
Continue drop in his blood pressure seen today in clinic. He is orthostatic with 20 point drop in systolic pressure. He is asymptomatic. We will hold his carvedilol. If blood pressure continues to drop, one option would be to add Florinef

## 2014-06-09 ENCOUNTER — Telehealth: Payer: Self-pay | Admitting: *Deleted

## 2014-06-09 NOTE — Telephone Encounter (Signed)
Jonetta OsgoodSheila Davis, nurse with [facility], called requesting a verbal order to add medical social work

## 2014-06-09 NOTE — Telephone Encounter (Signed)
That is fine 

## 2014-06-10 NOTE — Telephone Encounter (Signed)
Left vm advising nurse

## 2014-06-12 ENCOUNTER — Ambulatory Visit: Payer: Commercial Managed Care - HMO | Admitting: Adult Health

## 2014-06-15 ENCOUNTER — Encounter: Payer: Self-pay | Admitting: Internal Medicine

## 2014-06-15 ENCOUNTER — Ambulatory Visit (INDEPENDENT_AMBULATORY_CARE_PROVIDER_SITE_OTHER): Payer: Commercial Managed Care - HMO | Admitting: Internal Medicine

## 2014-06-15 VITALS — BP 98/58 | HR 82 | Temp 97.7°F | Ht 64.0 in | Wt 146.8 lb

## 2014-06-15 DIAGNOSIS — N179 Acute kidney failure, unspecified: Secondary | ICD-10-CM

## 2014-06-15 DIAGNOSIS — H6123 Impacted cerumen, bilateral: Secondary | ICD-10-CM

## 2014-06-15 DIAGNOSIS — H9193 Unspecified hearing loss, bilateral: Secondary | ICD-10-CM

## 2014-06-15 DIAGNOSIS — H6120 Impacted cerumen, unspecified ear: Secondary | ICD-10-CM | POA: Insufficient documentation

## 2014-06-15 DIAGNOSIS — I951 Orthostatic hypotension: Secondary | ICD-10-CM

## 2014-06-15 DIAGNOSIS — H9190 Unspecified hearing loss, unspecified ear: Secondary | ICD-10-CM

## 2014-06-15 NOTE — Assessment & Plan Note (Signed)
Recent ARF. Will recheck renal function and electrolytes with labs today.

## 2014-06-15 NOTE — Assessment & Plan Note (Signed)
Recent orthostatic hypotension. Asymptomatic. BP improved somewhat with stopping Carvedilol. Will continue to monitor.

## 2014-06-15 NOTE — Progress Notes (Signed)
Subjective:    Patient ID: Travis [name], male    DOB: 01/31/1916, 80 y.o.   MRN: [id number]  HPI 80YO male presents for hospital follow up.  S/p recent hospitalization 6/19 through 6/20 for dehydration and acute renal failure. Doing well after discharge. Increasing fluid intake. Exercising with PT twice weekly.  BP has been running low. One reading 76/46 at home this week, during which time he felt tired. Otherwise, most readings in 90s-120/50-70s and pt has been asymptomatic. Seen by Cardiology, stopped carvedilol.  Review of Systems  Constitutional: Negative for fever, chills, activity change, appetite change, fatigue and unexpected weight change.  HENT: Positive for hearing loss. Negative for ear discharge and ear pain.   Eyes: Negative for visual disturbance.  Respiratory: Negative for cough and shortness of breath.   Cardiovascular: Negative for chest pain, palpitations and leg swelling.  Gastrointestinal: Negative for abdominal pain and abdominal distention.  Genitourinary: Negative for dysuria, urgency and difficulty urinating.  Musculoskeletal: Negative for arthralgias and gait problem.  Skin: Negative for color change and rash.  Neurological: Negative for syncope and weakness.  Hematological: Negative for adenopathy.  Psychiatric/Behavioral: Negative for sleep disturbance and dysphoric mood. The patient is not nervous/anxious.        Objective:    BP 98/58  Pulse 82  Temp(Src) 97.7 F (36.5 C) (Oral)  Ht $R'5\' 4"'Solen$  (1.626 m)  Wt 146 lb 12 oz (66.565 kg)  BMI 25.18 kg/m2  SpO2 95% Physical Exam  Constitutional: He is oriented to person, place, and time. He appears well-developed and well-nourished. No distress.  HENT:  Head: Normocephalic and atraumatic.  Right Ear: External ear normal. Decreased hearing is noted.  Left Ear: External ear normal. Decreased hearing is noted.  Nose: Nose normal.  Mouth/Throat: Oropharynx is clear and moist. No oropharyngeal exudate.    Eyes: Conjunctivae and EOM are normal. Pupils are equal, round, and reactive to light. Right eye exhibits no discharge. Left eye exhibits no discharge. No scleral icterus.  Neck: Normal range of motion. Neck supple. No tracheal deviation present. No thyromegaly present.  Cardiovascular: Normal rate, regular rhythm and normal heart sounds.  Exam reveals no gallop and no friction rub.   No murmur heard. Pulmonary/Chest: Effort normal and breath sounds normal. No accessory muscle usage. Not tachypneic. No respiratory distress. He has no decreased breath sounds. He has no wheezes. He has no rhonchi. He has no rales. He exhibits no tenderness.  Musculoskeletal: Normal range of motion. He exhibits no edema.  Lymphadenopathy:    He has no cervical adenopathy.  Neurological: He is alert and oriented to person, place, and time. No cranial nerve deficit. Coordination normal.  Skin: Skin is warm and dry. No rash noted. He is not diaphoretic. No erythema. No pallor.  Psychiatric: He has a normal mood and affect. His behavior is normal. Judgment and thought content normal.          Assessment & Plan:   Problem List Items Addressed This Visit     Unprioritized   Acute renal failure syndrome - Primary     Recent ARF. Will recheck renal function and electrolytes with labs today.     Relevant Orders      Comp Met (CMET)   Cerumen impaction     Will set up ENT evaluation for removal of cerumen and hearing evaluation.    Relevant Orders      Ambulatory referral to ENT   Hearing loss   Relevant Orders  Ambulatory referral to ENT   Orthostatic hypotension     Recent orthostatic hypotension. Asymptomatic. BP improved somewhat with stopping Carvedilol. Will continue to monitor.        Return in about 3 months (around 09/15/2014).

## 2014-06-15 NOTE — Progress Notes (Signed)
Pre visit review using our clinic review tool, if applicable. No additional management support is needed unless otherwise documented below in the visit note. 

## 2014-06-15 NOTE — Assessment & Plan Note (Signed)
Will set up ENT evaluation for removal of cerumen and hearing evaluation.

## 2014-06-16 LAB — COMPREHENSIVE METABOLIC PANEL
ALBUMIN: 3.2 g/dL — AB (ref 3.5–5.2)
ALT: 19 U/L (ref 0–53)
AST: 22 U/L (ref 0–37)
Alkaline Phosphatase: 77 U/L (ref 39–117)
BUN: 24 mg/dL — ABNORMAL HIGH (ref 6–23)
CHLORIDE: 104 meq/L (ref 96–112)
CO2: 25 mEq/L (ref 19–32)
Calcium: 8.1 mg/dL — ABNORMAL LOW (ref 8.4–10.5)
Creatinine, Ser: 1.8 mg/dL — ABNORMAL HIGH (ref 0.4–1.5)
GFR: 38.39 mL/min — ABNORMAL LOW (ref >60.00)
Glucose, Bld: 111 mg/dL — ABNORMAL HIGH (ref 70–99)
POTASSIUM: 4.1 meq/L (ref 3.5–5.1)
Sodium: 136 mEq/L (ref 135–145)
TOTAL PROTEIN: 6.3 g/dL (ref 6.0–8.3)
Total Bilirubin: 0.4 mg/dL (ref 0.2–1.2)

## 2014-06-18 ENCOUNTER — Telehealth: Payer: Self-pay | Admitting: Internal Medicine

## 2014-06-18 NOTE — Telephone Encounter (Signed)
Patient complaint follow up

## 2014-06-22 ENCOUNTER — Ambulatory Visit: Payer: Commercial Managed Care - HMO | Admitting: Cardiovascular Disease

## 2014-08-03 ENCOUNTER — Other Ambulatory Visit: Payer: Self-pay

## 2014-08-03 MED ORDER — OMEPRAZOLE 20 MG PO CPDR: 20.0000 mg | DELAYED_RELEASE_CAPSULE | Freq: Every day | ORAL | Status: DC

## 2014-08-03 NOTE — Telephone Encounter (Signed)
Refill sent for omeprazole.  

## 2014-08-05 ENCOUNTER — Telehealth: Payer: Self-pay | Admitting: *Deleted

## 2014-08-05 NOTE — Telephone Encounter (Signed)
Sounds like he needs to be seen as "crackles" in his lungs could be from volume overload which contradicts her feeling he is dehydrated. Maybe we can work him in either this week or Monday

## 2014-08-05 NOTE — Telephone Encounter (Signed)
Nurse feels that pt is also dehydrated, wants to know if you would like any labs drawn?

## 2014-08-05 NOTE — Telephone Encounter (Signed)
Home health nurse called stating that she was going to discharge the pt today but now feels like she needs to continue, she hears a very light crackle in his lungs. Okay to give a verbal approval for continue care?

## 2014-08-05 NOTE — Telephone Encounter (Signed)
Fine to continue care

## 2014-08-06 NOTE — Telephone Encounter (Signed)
Left vm notifying daughter, [name].

## 2014-08-06 NOTE — Telephone Encounter (Signed)
Pt's daughter is asking if she can bring him in on Monday, she lives in [place] and will be coming into town to take him to an appt with Dr Mariah MillingGollan on Monday

## 2014-08-06 NOTE — Telephone Encounter (Signed)
If he is seeing Dr. Mariah MillingGollan on Monday, he will be able to assess his fluid status and make any medication changes as necessary.

## 2014-08-10 ENCOUNTER — Ambulatory Visit (INDEPENDENT_AMBULATORY_CARE_PROVIDER_SITE_OTHER): Payer: Commercial Managed Care - HMO | Admitting: Cardiovascular Disease

## 2014-08-10 ENCOUNTER — Encounter: Payer: Self-pay | Admitting: Cardiovascular Disease

## 2014-08-10 VITALS — BP 100/60 | HR 93 | Ht 64.0 in | Wt 145.5 lb

## 2014-08-10 DIAGNOSIS — I359 Nonrheumatic aortic valve disorder, unspecified: Secondary | ICD-10-CM

## 2014-08-10 DIAGNOSIS — Z79899 Other long term (current) drug therapy: Secondary | ICD-10-CM

## 2014-08-10 DIAGNOSIS — N183 Chronic kidney disease, stage 3 unspecified: Secondary | ICD-10-CM

## 2014-08-10 DIAGNOSIS — I951 Orthostatic hypotension: Secondary | ICD-10-CM

## 2014-08-10 DIAGNOSIS — I25810 Atherosclerosis of coronary artery bypass graft(s) without angina pectoris: Secondary | ICD-10-CM

## 2014-08-10 MED ORDER — FLUDROCORTISONE ACETATE 0.1 MG PO TABS: 0.1000 mg | ORAL_TABLET | Freq: Every day | ORAL | Status: AC

## 2014-08-10 NOTE — Patient Instructions (Addendum)
Blood pressure is low  We will check labs today, BMP  Please start florinef/fludrocortisone one a day  Please call us if you have new issues that need to be addressed before your next appt.  Your physician wants you to follow-up in: 4 weeks

## 2014-08-10 NOTE — Assessment & Plan Note (Signed)
Difficult to determine from his lab work whether he has any component of dehydration. Creatinine baseline is 1.7 or higher, sometimes trends up to 2, possibly with dehydration

## 2014-08-10 NOTE — Progress Notes (Signed)
Patient ID: Travis [name], male    DOB: January 11, 1916, 80 y.o.   MRN: [id number]  HPI Comments: Mr. Renbarger is a  80 year old male with a history of coronary artery disease, status post non-ST-elevation myocardial infarction in May 2006, CABG, history of sleep apnea, mild to moderate aortic insufficiency,  and a history of pulmonary embolus in the setting of fall in 2005,   renal insufficiency (poor fluid intake) with baseline creatinine 1.9, episode of left lower extremity edema, presented to the hospital December 11 2011 with ultrasound showing near complete occlusive thrombus of the left lower extremity extending from the common femoral vein through the superficial femoral vein to the popliteal vein. He was started on warfarin in the hospital and discharged. Shortly after, he developed what was felt to be blue toe Syndrome and he was changed to pradaxa 75 mg b.i.d..  He was found to be factor V Leiden positive  In followup today, his daughter presents with him. He has home nursing. We have not seen any blood pressure measurements provided by visiting nurses. He denies any lightheadedness or dizziness. He did hold his carvedilol after his last clinic visit.  He reports that he is drinking some fluids. He does have Meals on Wheels. He sleeps much of the day. Able to take care of himself and continues to live alone. Daughter reports no recent falls  Pradaxa was held in the hospital. recommendation was made to consider  Eliquis given his renal dysfunction. He continues to take low-dose carvedilol  ECHO 4/11 EF 50-55% mild to moderate AI, mild MR.     EKG shows normal sinus rhythm with rate 92 beats per minute with right bundle branch block        Outpatient Encounter Prescriptions as of 08/10/2014  Medication Sig  . apixaban (ELIQUIS) 5 MG TABS tablet Take 1 tablet (5 mg total) by mouth 2 (two) times daily.  Marland Kitchen atorvastatin (LIPITOR) 80 MG tablet Take 40 mg by mouth daily.  Marland Kitchen docusate sodium  (COLACE) 100 MG capsule Take 100 mg by mouth 2 (two) times daily.    Marland Kitchen dutasteride (AVODART) 0.5 MG capsule Take 0.5 mg by mouth. Take on Tuesday, Thursday, and Saturday.   . nystatin-triamcinolone ointment (MYCOLOG) Apply topically 2 (two) times daily. Apply topically 2 (two) times daily.  Marland Kitchen omeprazole (PRILOSEC) 20 MG capsule Take 1 capsule (20 mg total) by mouth daily.  . solifenacin (VESICARE) 5 MG tablet Take 1 tablet (5 mg total) by mouth daily.    Review of Systems  Constitutional: Negative.   HENT: Positive for hearing loss.   Eyes: Negative.   Respiratory: Negative.   Cardiovascular: Negative.   Gastrointestinal: Negative.   Endocrine: Negative.   Musculoskeletal: Positive for gait problem.  Skin: Negative.   Allergic/Immunologic: Negative.   Neurological: Negative.   Hematological: Negative.   Psychiatric/Behavioral: Negative.   All other systems reviewed and are negative.   BP 100/60  Pulse 93  Ht  (1.626 m)  Wt 145 lb 8 oz (65.998 kg)  BMI 24.96 kg/m2 Repeat blood pressure with systolic of 90 Physical Exam  Nursing note and vitals reviewed. Constitutional: He is oriented to person, place, and time. He appears well-developed and well-nourished.  HENT:  Head: Normocephalic.  Nose: Nose normal.  Mouth/Throat: Oropharynx is clear and moist.  Eyes: Conjunctivae are normal. Pupils are equal, round, and reactive to light.  Neck: Normal range of motion. Neck supple. No JVD present.  Cardiovascular: Normal rate, regular rhythm,  S1 normal, S2 normal and intact distal pulses.  Exam reveals no gallop and no friction rub.   Murmur heard.  Crescendo systolic murmur is present with a grade of 2/6  Pulmonary/Chest: Effort normal and breath sounds normal. No respiratory distress. He has no wheezes. He has no rales. He exhibits no tenderness.  Abdominal: Soft. Bowel sounds are normal. He exhibits no distension. There is no tenderness.  Musculoskeletal: Normal range of  motion. He exhibits no edema and no tenderness.  Lymphadenopathy:    He has no cervical adenopathy.  Neurological: He is alert and oriented to person, place, and time. Coordination normal.  Skin: Skin is warm and dry. No rash noted. No erythema.  Psychiatric: He has a normal mood and affect. His behavior is normal. Judgment and thought content normal.      Assessment and Plan

## 2014-08-10 NOTE — Assessment & Plan Note (Signed)
Currently with no symptoms of angina. No further workup at this time. Continue current medication regimen. 

## 2014-08-10 NOTE — Assessment & Plan Note (Addendum)
He continues to have low blood pressure today. Systolic of 90 even after holding carvedilol. He reports that he is drinking some fluids. Blood pressure was taken while sitting. We will start Florinef 0.1 mg daily with a BMP done today and again in 2 weeks' time. We will see him back in one month. I've asked visiting nurses to check his orthostatics 3 times per week and fax these numbers to [place]. Phone calls were placed to visiting nurses . If he does develop any electrolyte abnormalities, we could change the medicine to midodrine

## 2014-08-11 LAB — BASIC METABOLIC PANEL
BUN / CREAT RATIO: 17 (ref 10–22)
BUN: 32 mg/dL (ref 10–36)
CO2: 22 mmol/L (ref 18–29)
Calcium: 8.5 mg/dL — ABNORMAL LOW (ref 8.6–10.2)
Chloride: 101 mmol/L (ref 97–108)
Creatinine, Ser: 1.93 mg/dL — ABNORMAL HIGH (ref 0.76–1.27)
GFR calc Af Amer: 33 mL/min/{1.73_m2} — ABNORMAL LOW (ref >59)
GFR, EST NON AFRICAN AMERICAN: 29 mL/min/{1.73_m2} — AB (ref >59)
Glucose: 89 mg/dL (ref 65–99)
POTASSIUM: 4.7 mmol/L (ref 3.5–5.2)
SODIUM: 138 mmol/L (ref 134–144)

## 2014-08-24 ENCOUNTER — Encounter: Payer: Self-pay | Admitting: Internal Medicine

## 2014-09-01 ENCOUNTER — Telehealth: Payer: Self-pay | Admitting: *Deleted

## 2014-09-01 NOTE — Telephone Encounter (Signed)
Home Health nurse called and he is having swelling in legs ankles. Please call if any changes need to be made.

## 2014-09-01 NOTE — Telephone Encounter (Signed)
Spoke w/ Juliette Alcide, RN, pt's home health nurse.   She reports that pt's ankles are a bit swollen.   She states that she is hesitant to do anything right now, as pt has been dehydrated recently. Reports pt is not using the salt shaker, but has been drinking Gatorade.  She saw pt on Sat and advised him to keep his feet elevated, but reports that they are a bit more swollen today.  Please advise if any changes need to be made.

## 2014-09-11 ENCOUNTER — Encounter: Payer: Self-pay | Admitting: Cardiovascular Disease

## 2014-09-11 ENCOUNTER — Ambulatory Visit: Payer: Commercial Managed Care - HMO | Admitting: Internal Medicine

## 2014-09-11 ENCOUNTER — Ambulatory Visit (INDEPENDENT_AMBULATORY_CARE_PROVIDER_SITE_OTHER): Payer: Commercial Managed Care - HMO | Admitting: Cardiovascular Disease

## 2014-09-11 VITALS — BP 157/73 | HR 91 | Ht 64.5 in | Wt 151.0 lb

## 2014-09-11 DIAGNOSIS — I25810 Atherosclerosis of coronary artery bypass graft(s) without angina pectoris: Secondary | ICD-10-CM

## 2014-09-11 DIAGNOSIS — R609 Edema, unspecified: Secondary | ICD-10-CM

## 2014-09-11 DIAGNOSIS — I10 Essential (primary) hypertension: Secondary | ICD-10-CM

## 2014-09-11 DIAGNOSIS — I951 Orthostatic hypotension: Secondary | ICD-10-CM

## 2014-09-11 DIAGNOSIS — I82409 Acute embolism and thrombosis of unspecified deep veins of unspecified lower extremity: Secondary | ICD-10-CM

## 2014-09-11 DIAGNOSIS — N183 Chronic kidney disease, stage 3 unspecified: Secondary | ICD-10-CM

## 2014-09-11 DIAGNOSIS — R600 Localized edema: Secondary | ICD-10-CM

## 2014-09-11 DIAGNOSIS — I359 Nonrheumatic aortic valve disorder, unspecified: Secondary | ICD-10-CM

## 2014-09-11 NOTE — Assessment & Plan Note (Signed)
Baseline creatinine 1.9. Repeat basic metabolic panel done today

## 2014-09-11 NOTE — Assessment & Plan Note (Signed)
Blood pressure dramatically better on Florinef 0.1 mg daily. He has worsening leg edema and after discussion with his family, we will decrease the dose down to every other day with close monitoring of his blood pressure

## 2014-09-11 NOTE — Assessment & Plan Note (Signed)
Family reports worsening lower extremity edema. This actually appears to be more chronic venous insufficiency. At their request we will decrease the Florinef down to every other day. Recommended he wear compression hose but unclear if he is able to put these on himself

## 2014-09-11 NOTE — Assessment & Plan Note (Signed)
Currently with no symptoms of angina. No further workup at this time. Continue current medication regimen. 

## 2014-09-11 NOTE — Assessment & Plan Note (Signed)
Mild-to-moderate aortic valve regurgitation and 2011. Murmur auscultated on exam. He is asymptomatic

## 2014-09-11 NOTE — Assessment & Plan Note (Signed)
He is currently on anticoagulation. No recent falls

## 2014-09-11 NOTE — Progress Notes (Signed)
Patient ID: Travis [name], male    DOB: 07-14-1916, 80 y.o.   MRN: [id number]  HPI Comments: Mr. Chlebowski is a  80 year old male with a history of coronary artery disease, status post non-ST-elevation myocardial infarction in May 2006, CABG, history of sleep apnea, mild to moderate aortic insufficiency,  and a history of pulmonary embolus in the setting of fall in 2005,   renal insufficiency (poor fluid intake) with baseline creatinine 1.9, episode of left lower extremity edema, presented to the hospital December 11 2011 with ultrasound showing near complete occlusive thrombus of the left lower extremity extending from the common femoral vein through the superficial femoral vein to the popliteal vein. He was started on warfarin in the hospital and discharged. Shortly after, he developed what was felt to be blue toe Syndrome and he was changed to pradaxa 75 mg b.i.d..  He was found to be factor V Leiden positive He follows up today for orthostatic hypotension. He was started on Florinef on his last clinic visit  In followup today, his daughter presents with him. And home nursing was ordered for him. Nurses now visit him once per week.  Blood pressures have dramatically improved on Florinef 0.1 mg daily. Systolic pressure typically in the 130-140 range supine, 120 from a standing position. Heart rates in the 80 range Family reports that he is very shaky, has a tremor  He denies any lightheadedness or dizziness. Cord was placed on hold in the past for hypotension He does have Meals on Wheels daily He sleeps much of the day. Able to take care of himself and continues to live alone. Daughter reports no recent falls  Pradaxa was held in the hospital. He was started on eliquis twice a day.  ECHO 4/11 EF 50-55% mild to moderate AI, mild MR.     EKG shows normal sinus rhythm with rate 90 beats per minute with right bundle branch block        Outpatient Encounter Prescriptions as of 09/11/2014   Medication Sig  . apixaban (ELIQUIS) 5 MG TABS tablet Take 1 tablet (5 mg total) by mouth 2 (two) times daily.  Marland Kitchen atorvastatin (LIPITOR) 80 MG tablet Take 40 mg by mouth daily.  Marland Kitchen docusate sodium (COLACE) 100 MG capsule Take 100 mg by mouth 2 (two) times daily.    Marland Kitchen dutasteride (AVODART) 0.5 MG capsule Take 0.5 mg by mouth. Take on Tuesday, Thursday, and Saturday.   . fludrocortisone (FLORINEF) 0.1 MG tablet Take 1 tablet (0.1 mg total) by mouth daily.  Marland Kitchen nystatin-triamcinolone ointment (MYCOLOG) Apply topically 2 (two) times daily. Apply topically 2 (two) times daily.  Marland Kitchen omeprazole (PRILOSEC) 20 MG capsule Take 1 capsule (20 mg total) by mouth daily.  . solifenacin (VESICARE) 5 MG tablet Take 1 tablet (5 mg total) by mouth daily.      Review of Systems  Constitutional: Negative.   HENT: Positive for hearing loss.   Eyes: Negative.   Respiratory: Negative.   Cardiovascular: Negative.   Gastrointestinal: Negative.   Endocrine: Negative.   Musculoskeletal: Positive for gait problem.  Skin: Negative.   Allergic/Immunologic: Negative.   Neurological: Negative.   Hematological: Negative.   Psychiatric/Behavioral: Negative.   All other systems reviewed and are negative.   BP 157/73  Pulse 91  Ht 5' 4.5" (1.638 m)  Wt 151 lb (68.493 kg)  BMI 25.53 kg/m2  Physical Exam  Nursing note and vitals reviewed. Constitutional: He is oriented to person, place, and time. He appears  well-developed and well-nourished.  HENT:  Head: Normocephalic.  Nose: Nose normal.  Mouth/Throat: Oropharynx is clear and moist.  Eyes: Conjunctivae are normal. Pupils are equal, round, and reactive to light.  Neck: Normal range of motion. Neck supple. No JVD present.  Cardiovascular: Normal rate, regular rhythm, S1 normal, S2 normal and intact distal pulses.  Exam reveals no gallop and no friction rub.   Murmur heard.  Crescendo systolic murmur is present with a grade of 2/6  Pulmonary/Chest: Effort normal  and breath sounds normal. No respiratory distress. He has no wheezes. He has no rales. He exhibits no tenderness.  Abdominal: Soft. Bowel sounds are normal. He exhibits no distension. There is no tenderness.  Musculoskeletal: Normal range of motion. He exhibits no edema and no tenderness.  Lymphadenopathy:    He has no cervical adenopathy.  Neurological: He is alert and oriented to person, place, and time. Coordination normal.  Skin: Skin is warm and dry. No rash noted. No erythema.  Psychiatric: He has a normal mood and affect. His behavior is normal. Judgment and thought content normal.      Assessment and Plan

## 2014-09-11 NOTE — Patient Instructions (Addendum)
You are doing well.  Please decrease the florinef down to one pill every other day (Monday, Wednesday, Friday and Saturday)  Please call us if you have new issues that need to be addressed before your next appt.  Your physician wants you to follow-up in: 4 months.  You will receive a reminder letter in the mail two months in advance. If you don't receive a letter, please call our office to schedule the follow-up appointment.

## 2014-09-12 LAB — BASIC METABOLIC PANEL
BUN/Creatinine Ratio: 14 (ref 10–22)
BUN: 24 mg/dL (ref 10–36)
CO2: 23 mmol/L (ref 18–29)
Calcium: 8.2 mg/dL — ABNORMAL LOW (ref 8.6–10.2)
Chloride: 102 mmol/L (ref 97–108)
Creatinine, Ser: 1.66 mg/dL — ABNORMAL HIGH (ref 0.76–1.27)
GFR, EST AFRICAN AMERICAN: 40 mL/min/{1.73_m2} — AB (ref >59)
GFR, EST NON AFRICAN AMERICAN: 35 mL/min/{1.73_m2} — AB (ref >59)
Glucose: 99 mg/dL (ref 65–99)
POTASSIUM: 3.7 mmol/L (ref 3.5–5.2)
SODIUM: 142 mmol/L (ref 134–144)

## 2014-09-16 ENCOUNTER — Encounter: Payer: Self-pay | Admitting: Cardiovascular Disease

## 2014-09-18 ENCOUNTER — Encounter: Payer: Self-pay | Admitting: Cardiovascular Disease

## 2014-09-23 ENCOUNTER — Telehealth: Payer: Self-pay

## 2014-09-23 NOTE — Telephone Encounter (Signed)
Spoke w/ Juliette AlcideMelinda, RN w/ [facility].  Reports today is their last day of seeing pt, as he is stable and no longer in need of their services.  Reports that pt's daughter's were very unhappy at her last visit, as they felt that [facility] not keeping our office aware of pt's status.  She reports that she advised them that all of pt's orthostatic BP readings were in a notebook for pt to bring to last ov, as it is not their policy to call our office after each visit, due to the amount of paperwork involved.  She reports that she advised them that they always contact us with any abnormal findings or for something that requires our immediate attention.   She states that pt was advised to take Florinef on Mon, Wed, Fri & Sat, but family has not been giving on Saturdays. Reports pt's orthostatics have been good:  Today:  Lying: 138/78, sitting 138/78, standing 130/70.  Pt's leg edema has decreased.  Pt's family has not yet tried compression stockings.  She wanted to make sure that it is okay to continue Florinef three days a week.

## 2014-09-23 NOTE — Telephone Encounter (Signed)
Nurse with [facility] has a question regarding Florinef. Please call.

## 2014-09-25 NOTE — Telephone Encounter (Signed)
That should be fine, 3 days per week He can wear compressions  He is in his 90s and I think that family who live out of town we'll need to be more involved in his care.

## 2014-10-03 ENCOUNTER — Encounter: Payer: Self-pay | Admitting: Cardiovascular Disease

## 2014-10-09 ENCOUNTER — Other Ambulatory Visit: Payer: Self-pay

## 2014-10-09 MED ORDER — ATORVASTATIN CALCIUM 80 MG PO TABS
40.0000 mg | ORAL_TABLET | Freq: Every day | ORAL | Status: DC
Start: 2014-10-09 — End: 2014-11-11

## 2014-10-09 NOTE — Telephone Encounter (Signed)
Refill sent for atorvastatin 80 mg take 1/2 tablet daily.

## 2014-10-22 ENCOUNTER — Encounter: Payer: Self-pay | Admitting: Internal Medicine

## 2014-10-23 ENCOUNTER — Inpatient Hospital Stay: Payer: Self-pay | Admitting: Internal Medicine

## 2014-10-23 LAB — CBC
HCT: 38.2 % — ABNORMAL LOW (ref 40.0–52.0)
HGB: 12.6 g/dL — AB (ref 13.0–18.0)
MCH: 31.3 pg (ref 26.0–34.0)
MCHC: 33.1 g/dL (ref 32.0–36.0)
MCV: 95 fL (ref 80–100)
Platelet: 197 10*3/uL (ref 150–440)
RBC: 4.03 10*6/uL — AB (ref 4.40–5.90)
RDW: 13.2 % (ref 11.5–14.5)
WBC: 7.4 10*3/uL (ref 3.8–10.6)

## 2014-10-23 LAB — COMPREHENSIVE METABOLIC PANEL
ALBUMIN: 2.7 g/dL — AB (ref 3.4–5.0)
ALT: 395 U/L — AB
Alkaline Phosphatase: 297 U/L — ABNORMAL HIGH
Anion Gap: 11 (ref 7–16)
BILIRUBIN TOTAL: 3.9 mg/dL — AB (ref 0.2–1.0)
BUN: 30 mg/dL — ABNORMAL HIGH (ref 7–18)
CALCIUM: 8.1 mg/dL — AB (ref 8.5–10.1)
CREATININE: 1.65 mg/dL — AB (ref 0.60–1.30)
Chloride: 103 mmol/L (ref 98–107)
Co2: 28 mmol/L (ref 21–32)
EGFR (African American): 50 — ABNORMAL LOW
GFR CALC NON AF AMER: 41 — AB
Glucose: 96 mg/dL (ref 65–99)
OSMOLALITY: 289 (ref 275–301)
POTASSIUM: 3.7 mmol/L (ref 3.5–5.1)
SGOT(AST): 599 U/L — ABNORMAL HIGH (ref 15–37)
Sodium: 142 mmol/L (ref 136–145)
TOTAL PROTEIN: 6.8 g/dL (ref 6.4–8.2)

## 2014-10-23 LAB — URINALYSIS, COMPLETE
BLOOD: NEGATIVE
Bacteria: NONE SEEN
Bilirubin,UR: NEGATIVE
Glucose,UR: NEGATIVE mg/dL (ref 0–75)
Ketone: NEGATIVE
LEUKOCYTE ESTERASE: NEGATIVE
Nitrite: NEGATIVE
Ph: 7 (ref 4.5–8.0)
Specific Gravity: 1.013 (ref 1.003–1.030)
Squamous Epithelial: NONE SEEN
WBC UR: 1 /HPF (ref 0–5)

## 2014-10-23 LAB — PROTIME-INR
INR: 1.2
PROTHROMBIN TIME: 14.7 s (ref 11.5–14.7)

## 2014-10-23 LAB — LIPASE, BLOOD: LIPASE: 144 U/L (ref 73–393)

## 2014-10-23 LAB — APTT: Activated PTT: 28.7 secs (ref 23.6–35.9)

## 2014-10-23 LAB — TROPONIN I: Troponin-I: 0.02 ng/mL

## 2014-10-24 LAB — CBC WITH DIFFERENTIAL/PLATELET
BASOS ABS: 0 10*3/uL (ref 0.0–0.1)
BASOS PCT: 0.2 %
Eosinophil #: 0.3 10*3/uL (ref 0.0–0.7)
Eosinophil %: 5 %
HCT: 34.7 % — ABNORMAL LOW (ref 40.0–52.0)
HGB: 11.7 g/dL — ABNORMAL LOW (ref 13.0–18.0)
LYMPHS PCT: 16.8 %
Lymphocyte #: 1 10*3/uL (ref 1.0–3.6)
MCH: 31.4 pg (ref 26.0–34.0)
MCHC: 33.7 g/dL (ref 32.0–36.0)
MCV: 93 fL (ref 80–100)
MONO ABS: 0.5 x10 3/mm (ref 0.2–1.0)
Monocyte %: 8.4 %
Neutrophil #: 4.1 10*3/uL (ref 1.4–6.5)
Neutrophil %: 69.6 %
PLATELETS: 179 10*3/uL (ref 150–440)
RBC: 3.72 10*6/uL — ABNORMAL LOW (ref 4.40–5.90)
RDW: 13.5 % (ref 11.5–14.5)
WBC: 6 10*3/uL (ref 3.8–10.6)

## 2014-10-24 LAB — BASIC METABOLIC PANEL
ANION GAP: 9 (ref 7–16)
BUN: 20 mg/dL — AB (ref 7–18)
CALCIUM: 7.4 mg/dL — AB (ref 8.5–10.1)
CO2: 25 mmol/L (ref 21–32)
Chloride: 107 mmol/L (ref 98–107)
Creatinine: 1.41 mg/dL — ABNORMAL HIGH (ref 0.60–1.30)
EGFR (African American): >60
EGFR (Non-African Amer.): 50 — ABNORMAL LOW
GLUCOSE: 84 mg/dL (ref 65–99)
Osmolality: 283 (ref 275–301)
Potassium: 3.2 mmol/L — ABNORMAL LOW (ref 3.5–5.1)
Sodium: 141 mmol/L (ref 136–145)

## 2014-10-25 LAB — COMPREHENSIVE METABOLIC PANEL
ANION GAP: 8 (ref 7–16)
AST: 205 U/L — AB (ref 15–37)
Albumin: 2.3 g/dL — ABNORMAL LOW (ref 3.4–5.0)
Alkaline Phosphatase: 241 U/L — ABNORMAL HIGH
BUN: 17 mg/dL (ref 7–18)
Bilirubin,Total: 1 mg/dL (ref 0.2–1.0)
CALCIUM: 7.3 mg/dL — AB (ref 8.5–10.1)
Chloride: 109 mmol/L — ABNORMAL HIGH (ref 98–107)
Co2: 25 mmol/L (ref 21–32)
Creatinine: 1.39 mg/dL — ABNORMAL HIGH (ref 0.60–1.30)
EGFR (Non-African Amer.): 50 — ABNORMAL LOW
Glucose: 89 mg/dL (ref 65–99)
Osmolality: 284 (ref 275–301)
Potassium: 3.5 mmol/L (ref 3.5–5.1)
SGPT (ALT): 214 U/L — ABNORMAL HIGH
SODIUM: 142 mmol/L (ref 136–145)
Total Protein: 5.9 g/dL — ABNORMAL LOW (ref 6.4–8.2)

## 2014-10-26 LAB — COMPREHENSIVE METABOLIC PANEL
Albumin: 2.7 g/dL — ABNORMAL LOW (ref 3.4–5.0)
Alkaline Phosphatase: 233 U/L — ABNORMAL HIGH
Anion Gap: 9 (ref 7–16)
BILIRUBIN TOTAL: 0.8 mg/dL (ref 0.2–1.0)
BUN: 15 mg/dL (ref 7–18)
Calcium, Total: 7.8 mg/dL — ABNORMAL LOW (ref 8.5–10.1)
Chloride: 105 mmol/L (ref 98–107)
Co2: 24 mmol/L (ref 21–32)
Creatinine: 1.42 mg/dL — ABNORMAL HIGH (ref 0.60–1.30)
EGFR (Non-African Amer.): 49 — ABNORMAL LOW
GFR CALC AF AMER: 60 — AB
GLUCOSE: 99 mg/dL (ref 65–99)
Osmolality: 277 (ref 275–301)
Potassium: 3.5 mmol/L (ref 3.5–5.1)
SGOT(AST): 135 U/L — ABNORMAL HIGH (ref 15–37)
SGPT (ALT): 172 U/L — ABNORMAL HIGH
Sodium: 138 mmol/L (ref 136–145)
Total Protein: 6.4 g/dL (ref 6.4–8.2)

## 2014-10-26 LAB — URINALYSIS, COMPLETE
Bacteria: NONE SEEN
Bilirubin,UR: NEGATIVE
Blood: NEGATIVE
Glucose,UR: NEGATIVE mg/dL (ref 0–75)
KETONE: NEGATIVE
LEUKOCYTE ESTERASE: NEGATIVE
Nitrite: NEGATIVE
PH: 7 (ref 4.5–8.0)
Protein: NEGATIVE
SPECIFIC GRAVITY: 1.006 (ref 1.003–1.030)
SQUAMOUS EPITHELIAL: NONE SEEN
WBC UR: <1 /HPF (ref 0–5)

## 2014-10-26 LAB — AMMONIA: AMMONIA, PLASMA: 12 umol/L (ref 11–32)

## 2014-10-27 LAB — CBC WITH DIFFERENTIAL/PLATELET
BASOS ABS: 0 10*3/uL (ref 0.0–0.1)
Basophil %: 0.4 %
Eosinophil #: 0.5 10*3/uL (ref 0.0–0.7)
Eosinophil %: 7.1 %
HCT: 37.3 % — ABNORMAL LOW (ref 40.0–52.0)
HGB: 12.7 g/dL — ABNORMAL LOW (ref 13.0–18.0)
Lymphocyte #: 1.9 10*3/uL (ref 1.0–3.6)
Lymphocyte %: 30.3 %
MCH: 31.8 pg (ref 26.0–34.0)
MCHC: 34.1 g/dL (ref 32.0–36.0)
MCV: 93 fL (ref 80–100)
MONOS PCT: 10.9 %
Monocyte #: 0.7 x10 3/mm (ref 0.2–1.0)
NEUTROS ABS: 3.3 10*3/uL (ref 1.4–6.5)
NEUTROS PCT: 51.3 %
Platelet: 208 10*3/uL (ref 150–440)
RBC: 3.99 10*6/uL — ABNORMAL LOW (ref 4.40–5.90)
RDW: 13.3 % (ref 11.5–14.5)
WBC: 6.3 10*3/uL (ref 3.8–10.6)

## 2014-10-27 LAB — COMPREHENSIVE METABOLIC PANEL
ALK PHOS: 223 U/L — AB
AST: 93 U/L — AB (ref 15–37)
Albumin: 2.5 g/dL — ABNORMAL LOW (ref 3.4–5.0)
Anion Gap: 9 (ref 7–16)
BILIRUBIN TOTAL: 0.7 mg/dL (ref 0.2–1.0)
BUN: 17 mg/dL (ref 7–18)
CHLORIDE: 108 mmol/L — AB (ref 98–107)
CREATININE: 1.45 mg/dL — AB (ref 0.60–1.30)
Calcium, Total: 8.1 mg/dL — ABNORMAL LOW (ref 8.5–10.1)
Co2: 24 mmol/L (ref 21–32)
EGFR (Non-African Amer.): 48 — ABNORMAL LOW
GFR CALC AF AMER: 58 — AB
GLUCOSE: 99 mg/dL (ref 65–99)
OSMOLALITY: 283 (ref 275–301)
Potassium: 4.2 mmol/L (ref 3.5–5.1)
SGPT (ALT): 131 U/L — ABNORMAL HIGH
Sodium: 141 mmol/L (ref 136–145)
Total Protein: 6.4 g/dL (ref 6.4–8.2)

## 2014-10-28 ENCOUNTER — Encounter: Payer: Self-pay | Admitting: Internal Medicine

## 2014-10-28 ENCOUNTER — Ambulatory Visit: Payer: Self-pay | Admitting: Neurology

## 2014-10-28 LAB — COMPREHENSIVE METABOLIC PANEL
ALBUMIN: 2.6 g/dL — AB (ref 3.4–5.0)
ANION GAP: 7 (ref 7–16)
AST: 81 U/L — AB (ref 15–37)
Alkaline Phosphatase: 218 U/L — ABNORMAL HIGH
BILIRUBIN TOTAL: 0.8 mg/dL (ref 0.2–1.0)
BUN: 21 mg/dL — AB (ref 7–18)
CALCIUM: 8.3 mg/dL — AB (ref 8.5–10.1)
CHLORIDE: 107 mmol/L (ref 98–107)
Co2: 26 mmol/L (ref 21–32)
Creatinine: 1.66 mg/dL — ABNORMAL HIGH (ref 0.60–1.30)
EGFR (African American): 50 — ABNORMAL LOW
GFR CALC NON AF AMER: 41 — AB
GLUCOSE: 94 mg/dL (ref 65–99)
Osmolality: 282 (ref 275–301)
POTASSIUM: 5.1 mmol/L (ref 3.5–5.1)
SGPT (ALT): 113 U/L — ABNORMAL HIGH
Sodium: 140 mmol/L (ref 136–145)
TOTAL PROTEIN: 6.6 g/dL (ref 6.4–8.2)

## 2014-10-29 ENCOUNTER — Ambulatory Visit: Payer: Medicare Other | Admitting: Podiatry

## 2014-10-29 LAB — COMPREHENSIVE METABOLIC PANEL
AST: 57 U/L — AB (ref 15–37)
Albumin: 2.5 g/dL — ABNORMAL LOW (ref 3.4–5.0)
Alkaline Phosphatase: 182 U/L — ABNORMAL HIGH
Anion Gap: 8 (ref 7–16)
BUN: 23 mg/dL — AB (ref 7–18)
Bilirubin,Total: 0.8 mg/dL (ref 0.2–1.0)
CREATININE: 1.53 mg/dL — AB (ref 0.60–1.30)
Calcium, Total: 7.8 mg/dL — ABNORMAL LOW (ref 8.5–10.1)
Chloride: 106 mmol/L (ref 98–107)
Co2: 22 mmol/L (ref 21–32)
EGFR (African American): 55 — ABNORMAL LOW
GFR CALC NON AF AMER: 45 — AB
GLUCOSE: 88 mg/dL (ref 65–99)
Osmolality: 275 (ref 275–301)
POTASSIUM: 4.1 mmol/L (ref 3.5–5.1)
SGPT (ALT): 86 U/L — ABNORMAL HIGH
SODIUM: 136 mmol/L (ref 136–145)
TOTAL PROTEIN: 6.2 g/dL — AB (ref 6.4–8.2)

## 2014-10-30 LAB — COMPREHENSIVE METABOLIC PANEL
ALBUMIN: 2.3 g/dL — AB (ref 3.4–5.0)
ALT: 67 U/L — AB
Alkaline Phosphatase: 169 U/L — ABNORMAL HIGH
Anion Gap: 10 (ref 7–16)
BUN: 23 mg/dL — ABNORMAL HIGH (ref 7–18)
Bilirubin,Total: 0.6 mg/dL (ref 0.2–1.0)
CHLORIDE: 109 mmol/L — AB (ref 98–107)
Calcium, Total: 7.3 mg/dL — ABNORMAL LOW (ref 8.5–10.1)
Co2: 22 mmol/L (ref 21–32)
Creatinine: 1.2 mg/dL (ref 0.60–1.30)
EGFR (African American): >60
GFR CALC NON AF AMER: 60 — AB
Glucose: 108 mg/dL — ABNORMAL HIGH (ref 65–99)
OSMOLALITY: 285 (ref 275–301)
Potassium: 4.5 mmol/L (ref 3.5–5.1)
SGOT(AST): 53 U/L — ABNORMAL HIGH (ref 15–37)
Sodium: 141 mmol/L (ref 136–145)
Total Protein: 5.7 g/dL — ABNORMAL LOW (ref 6.4–8.2)

## 2014-10-30 LAB — AMMONIA: Ammonia, Plasma: 25 mcmol/L (ref 11–32)

## 2014-10-31 LAB — HEMOGLOBIN: HGB: 13.1 g/dL (ref 13.0–18.0)

## 2014-11-01 LAB — COMPREHENSIVE METABOLIC PANEL
ALT: 48 U/L
ANION GAP: 10 (ref 7–16)
Albumin: 2.2 g/dL — ABNORMAL LOW (ref 3.4–5.0)
Alkaline Phosphatase: 153 U/L — ABNORMAL HIGH
BUN: 23 mg/dL — ABNORMAL HIGH (ref 7–18)
Bilirubin,Total: 0.3 mg/dL (ref 0.2–1.0)
CALCIUM: 7.2 mg/dL — AB (ref 8.5–10.1)
Chloride: 110 mmol/L — ABNORMAL HIGH (ref 98–107)
Co2: 22 mmol/L (ref 21–32)
Creatinine: 1.46 mg/dL — ABNORMAL HIGH (ref 0.60–1.30)
EGFR (African American): 58 — ABNORMAL LOW
EGFR (Non-African Amer.): 48 — ABNORMAL LOW
GLUCOSE: 106 mg/dL — AB (ref 65–99)
Osmolality: 287 (ref 275–301)
Potassium: 3.8 mmol/L (ref 3.5–5.1)
SGOT(AST): 32 U/L (ref 15–37)
Sodium: 142 mmol/L (ref 136–145)
Total Protein: 5.9 g/dL — ABNORMAL LOW (ref 6.4–8.2)

## 2014-11-04 ENCOUNTER — Encounter: Payer: Self-pay | Admitting: Internal Medicine

## 2014-11-04 LAB — BASIC METABOLIC PANEL
ANION GAP: 5 — AB (ref 7–16)
BUN: 28 mg/dL — ABNORMAL HIGH (ref 7–18)
CALCIUM: 7.9 mg/dL — AB (ref 8.5–10.1)
CO2: 26 mmol/L (ref 21–32)
Chloride: 104 mmol/L (ref 98–107)
Creatinine: 1.58 mg/dL — ABNORMAL HIGH (ref 0.60–1.30)
GLUCOSE: 115 mg/dL — AB (ref 65–99)
OSMOLALITY: 276 (ref 275–301)
POTASSIUM: 4 mmol/L (ref 3.5–5.1)
SODIUM: 135 mmol/L — AB (ref 136–145)

## 2014-11-04 LAB — HEMOGLOBIN: HGB: 12.6 g/dL — AB (ref 13.0–18.0)

## 2014-11-05 ENCOUNTER — Telehealth: Payer: Self-pay | Admitting: Internal Medicine

## 2014-11-05 ENCOUNTER — Encounter: Payer: Self-pay | Admitting: Internal Medicine

## 2014-11-05 NOTE — Telephone Encounter (Signed)
Pt needs HFU for n/v, elevated LFT, pulmonary embolism. Please advise where to add to schedule.msn

## 2014-11-09 ENCOUNTER — Telehealth: Payer: Self-pay | Admitting: *Deleted

## 2014-11-09 NOTE — Telephone Encounter (Signed)
Discharge date: 11/06/14  Transition Care Management Follow-up Telephone Call  How have you been since you were released from the hospital? Pt daughter states " he is walking with a walker now, sleeping all night and eating well, but having some urinary incontinence, he has wet himself a few times since leaving the hospital"    Do you understand why you were in the hospital? YES   Do you understand the discharge instrcutions? YES  Items Reviewed:  Medications reviewed: YES  Allergies reviewed: NO  Dietary changes reviewed: NO  Referrals reviewed: YES - Pt's daughter states that they refused home health, she also states that she is a Physical therapist and has been working with her father    Functional Questionnaire:   Activities of Daily Living (ADLs):   He states they are independent in the following:  States they require assistance with the following:    Any transportation issues/concerns?: NO   Any patient concerns? YES - daughter states that she has many concerns but she will adress them during apt tomorrow    Confirmed importance and date/time of follow-up visits scheduled: YES, Tomorrow Nov 23 at 1:00   Confirmed with patient if condition begins to worsen call PCP or go to the ER.  Patient was given the Call-a-Nurse line [phone]: NO

## 2014-11-09 NOTE — Telephone Encounter (Signed)
Please add pt to tomorrows schedule at 1:00 for hosp f/u

## 2014-11-10 ENCOUNTER — Ambulatory Visit: Payer: Commercial Managed Care - HMO | Admitting: Internal Medicine

## 2014-11-11 ENCOUNTER — Ambulatory Visit: Payer: Commercial Managed Care - HMO | Admitting: Internal Medicine

## 2014-11-11 ENCOUNTER — Ambulatory Visit (INDEPENDENT_AMBULATORY_CARE_PROVIDER_SITE_OTHER): Payer: Commercial Managed Care - HMO | Admitting: Internal Medicine

## 2014-11-11 ENCOUNTER — Encounter: Payer: Self-pay | Admitting: Internal Medicine

## 2014-11-11 VITALS — BP 86/52 | HR 110 | Temp 97.4°F | Ht 64.0 in

## 2014-11-11 DIAGNOSIS — I951 Orthostatic hypotension: Secondary | ICD-10-CM

## 2014-11-11 DIAGNOSIS — G20 Parkinson's disease: Secondary | ICD-10-CM | POA: Insufficient documentation

## 2014-11-11 DIAGNOSIS — K8010 Calculus of gallbladder with chronic cholecystitis without obstruction: Secondary | ICD-10-CM | POA: Insufficient documentation

## 2014-11-11 LAB — CBC WITH DIFFERENTIAL/PLATELET
Basophils Absolute: 0 10*3/uL (ref 0.0–0.1)
Basophils Relative: 0.5 % (ref 0.0–3.0)
Eosinophils Absolute: 0.4 10*3/uL (ref 0.0–0.7)
Eosinophils Relative: 6.7 % — ABNORMAL HIGH (ref 0.0–5.0)
HCT: 37.8 % — ABNORMAL LOW (ref 39.0–52.0)
Hemoglobin: 12.4 g/dL — ABNORMAL LOW (ref 13.0–17.0)
LYMPHS PCT: 25.2 % (ref 12.0–46.0)
Lymphs Abs: 1.6 10*3/uL (ref 0.7–4.0)
MCHC: 32.9 g/dL (ref 30.0–36.0)
MCV: 93.2 fl (ref 78.0–100.0)
MONOS PCT: 9.3 % (ref 3.0–12.0)
Monocytes Absolute: 0.6 10*3/uL (ref 0.1–1.0)
NEUTROS PCT: 58.3 % (ref 43.0–77.0)
Neutro Abs: 3.6 10*3/uL (ref 1.4–7.7)
PLATELETS: 330 10*3/uL (ref 150.0–400.0)
RBC: 4.05 Mil/uL — ABNORMAL LOW (ref 4.22–5.81)
RDW: 14.2 % (ref 11.5–15.5)
WBC: 6.2 10*3/uL (ref 4.0–10.5)

## 2014-11-11 LAB — COMPREHENSIVE METABOLIC PANEL
ALT: 11 U/L (ref 0–53)
AST: 27 U/L (ref 0–37)
Albumin: 3 g/dL — ABNORMAL LOW (ref 3.5–5.2)
Alkaline Phosphatase: 118 U/L — ABNORMAL HIGH (ref 39–117)
BILIRUBIN TOTAL: 0.7 mg/dL (ref 0.2–1.2)
BUN: 21 mg/dL (ref 6–23)
CALCIUM: 8.6 mg/dL (ref 8.4–10.5)
CO2: 25 meq/L (ref 19–32)
CREATININE: 1.8 mg/dL — AB (ref 0.4–1.5)
Chloride: 102 mEq/L (ref 96–112)
GFR: 37.86 mL/min — AB (ref >60.00)
Glucose, Bld: 85 mg/dL (ref 70–99)
Potassium: 4.3 mEq/L (ref 3.5–5.1)
Sodium: 135 mEq/L (ref 135–145)
TOTAL PROTEIN: 6.2 g/dL (ref 6.0–8.3)

## 2014-11-11 MED ORDER — SOLIFENACIN SUCCINATE 5 MG PO TABS: 5.0000 mg | ORAL_TABLET | Freq: Every day | ORAL | Status: AC

## 2014-11-11 MED ORDER — CLONAZEPAM 0.5 MG PO TABS: 0.5000 mg | ORAL_TABLET | Freq: Every day | ORAL | Status: AC

## 2014-11-11 MED ORDER — DUTASTERIDE 0.5 MG PO CAPS: ORAL_CAPSULE | ORAL | Status: AC

## 2014-11-11 NOTE — Progress Notes (Signed)
Pre visit review using our clinic review tool, if applicable. No additional management support is needed unless otherwise documented below in the visit note. 

## 2014-11-11 NOTE — Assessment & Plan Note (Signed)
Significant worsening of hypotension on Amantadine and Sinemet. Will hold these medications. Continue Florinef. Pt daughter will monitor BP at home and email with readings. He will be moving to [place] and will establish care there. Follow up as needed here

## 2014-11-11 NOTE — Progress Notes (Signed)
Subjective:    Patient ID: Travis [name], male    DOB: May 15, 1916, 80 y.o.   MRN: [id number]  HPI 80YO male presents for hospital follow up.  ADMITTED: 10/23/2014 DISCHARGED: 11/06/2014  DIAGNOSIS: 1. Altered mental status, likely secondary to side effect from Haldol 2. Cholelithiasis 3. Parkinson Disease 4. CKDIII 5. History of PE  Initially admitted for NV.Found to have marked elevation of LFTs with AST 599 and ALT 395. US gallbladder showed gallbladder sludge and wall thickening. He was given IVF and IV antibiotics. Weaned to oral Levaquin and Flagyl with plan for conservative management. During hospitalization, he had an episode of confusion and was given single dose of Haldo, per records. After which he developed myoclonic jerks, drowsiness, and worsening tremors. He was treated with Cogentin. Neurology also recommended that he be started on Amantadine for underlying Parkinson's.  Family opted to take pt home with assistance of home health. BP had been low prior to discharge, after starting Amantadine.  Planning to take him home to [place] with daughter. May be indefinitely. He has refused nursing home.  No nausea or vomiting at home. Appetite has been good. No diarrhea. BMs are regular daily. Having some incontinence of urine.  No fever at home.   Seems more lethargic today, but has generally been active and working with daughter who is a physical therapist.  Review of Systems  Constitutional: Positive for activity change and fatigue. Negative for fever, chills, appetite change and unexpected weight change.  Eyes: Negative for visual disturbance.  Respiratory: Negative for cough and shortness of breath.   Cardiovascular: Negative for chest pain, palpitations and leg swelling.  Gastrointestinal: Negative for nausea, vomiting, abdominal pain, diarrhea, constipation and abdominal distention.  Genitourinary: Negative for dysuria, urgency and difficulty urinating.    Musculoskeletal: Negative for arthralgias and gait problem.  Skin: Negative for color change and rash.  Hematological: Negative for adenopathy.  Psychiatric/Behavioral: Negative for sleep disturbance and dysphoric mood. The patient is not nervous/anxious.        Objective:    BP 86/52 mmHg  Pulse 110  Temp(Src) 97.4 F (36.3 C) (Axillary)  Ht 5\' 4"  (1.626 m)  SpO2 97% Physical Exam  Constitutional: He is oriented to person, place, and time. He appears well-developed and well-nourished. He appears lethargic.  Non-toxic appearance. No distress.  Elderly male, sitting in wheelchair. Sleeps through majority of visit, but will wake to answer questions.  HENT:  Head: Normocephalic and atraumatic.  Right Ear: External ear normal.  Left Ear: External ear normal.  Nose: Nose normal.  Mouth/Throat: Oropharynx is clear and moist. No oropharyngeal exudate.  Eyes: Conjunctivae and EOM are normal. Pupils are equal, round, and reactive to light. Right eye exhibits no discharge. Left eye exhibits no discharge. No scleral icterus.  Neck: Normal range of motion. Neck supple. No tracheal deviation present. No thyromegaly present.  Cardiovascular: Regular rhythm and normal heart sounds.  Tachycardia present.  Exam reveals no gallop and no friction rub.   No murmur heard. Pulmonary/Chest: Effort normal and breath sounds normal. No accessory muscle usage. No tachypnea. No respiratory distress. He has no decreased breath sounds. He has no wheezes. He has no rhonchi. He has no rales. He exhibits no tenderness.  Musculoskeletal: Normal range of motion. He exhibits no edema.  Lymphadenopathy:    He has no cervical adenopathy.  Neurological: He is oriented to person, place, and time. He appears lethargic. No cranial nerve deficit. Coordination normal.  Skin: Skin is warm and  dry. No rash noted. He is not diaphoretic. No erythema. No pallor.  Psychiatric: He has a normal mood and affect. His behavior is  normal. Judgment and thought content normal.          Assessment & Plan:   Problem List Items Addressed This Visit      Unprioritized   Cholelithiasis with cholecystitis of other acuity - Primary    Recent admission for nausea/vomiting likely secondary to cholecystitis. Symptoms have resolved. Will check CMP with labs today.    Relevant Orders      Comprehensive metabolic panel      CBC w/Diff   Orthostatic hypotension    Significant worsening of hypotension on Amantadine and Sinemet. Will hold these medications. Continue Florinef. Pt daughter will monitor BP at home and email with readings. He will be moving to [place] and will establish care there. Follow up as needed here    Parkinson's disease    Recent diagnosis of Parkinson's disease. Started on Amantadine and Sinemet. Having worsening hypotension on these medications. Will hold medication for now. [facility] as [facility] with neurology.    Relevant Medications      clonazePAM (KLONOPIN)  tablet       Return in about 4 weeks (around 12/09/2014) for Recheck.

## 2014-11-11 NOTE — Assessment & Plan Note (Signed)
Recent diagnosis of Parkinson's disease. Started on Amantadine and Sinemet. Having worsening hypotension on these medications. Will hold medication for now. [facility] with neurology.

## 2014-11-11 NOTE — Assessment & Plan Note (Signed)
Recent admission for nausea/vomiting likely secondary to cholecystitis. Symptoms have resolved. Will check CMP with labs today.

## 2014-11-11 NOTE — Patient Instructions (Addendum)
Stop Amantadine and Sinemet.  Monitor Blood Pressure at home daily and email with readings.  Labs today.  Follow up in 4 weeks and as needed.

## 2014-11-13 ENCOUNTER — Encounter: Payer: Self-pay | Admitting: Internal Medicine

## 2014-11-13 ENCOUNTER — Ambulatory Visit: Payer: Commercial Managed Care - HMO | Admitting: Internal Medicine

## 2014-11-30 ENCOUNTER — Telehealth: Payer: Self-pay

## 2014-11-30 NOTE — Telephone Encounter (Signed)
We can work him in if she would like, but aren't they living in another area now?

## 2014-11-30 NOTE — Telephone Encounter (Signed)
Patient is in [place] with daughter, Travis [name].  She is requesting for you to call her, she has sent him up for an appt with a PCP but appt is not until Jan and she is wanting your guidance.  [name] phone number [phone].

## 2014-11-30 NOTE — Telephone Encounter (Signed)
The patient's daughter called hoping to speak with Dr.Walker to get advice on his healthcare.  She states he is "decling".  I asked for more specific information, but this is what she gave me.  Do you want him worked in? If so,when?

## 2014-12-01 NOTE — Telephone Encounter (Signed)
Spoke with pt daughter, [name].

## 2014-12-04 DIAGNOSIS — G20 Parkinson's disease: Secondary | ICD-10-CM

## 2014-12-04 DIAGNOSIS — I951 Orthostatic hypotension: Secondary | ICD-10-CM

## 2014-12-16 ENCOUNTER — Encounter: Payer: Self-pay | Admitting: *Deleted

## 2014-12-30 DIAGNOSIS — W19XXXA Unspecified fall, initial encounter: Secondary | ICD-10-CM | POA: Diagnosis not present

## 2014-12-30 DIAGNOSIS — N183 Chronic kidney disease, stage 3 (moderate): Secondary | ICD-10-CM | POA: Diagnosis not present

## 2014-12-30 DIAGNOSIS — R4189 Other symptoms and signs involving cognitive functions and awareness: Secondary | ICD-10-CM | POA: Diagnosis not present

## 2014-12-30 DIAGNOSIS — I951 Orthostatic hypotension: Secondary | ICD-10-CM | POA: Diagnosis not present

## 2014-12-30 DIAGNOSIS — N3941 Urge incontinence: Secondary | ICD-10-CM | POA: Diagnosis not present

## 2014-12-30 DIAGNOSIS — H9193 Unspecified hearing loss, bilateral: Secondary | ICD-10-CM | POA: Diagnosis not present

## 2015-01-14 DIAGNOSIS — R4189 Other symptoms and signs involving cognitive functions and awareness: Secondary | ICD-10-CM | POA: Diagnosis not present

## 2015-01-14 DIAGNOSIS — N183 Chronic kidney disease, stage 3 (moderate): Secondary | ICD-10-CM | POA: Diagnosis not present

## 2015-02-10 DIAGNOSIS — I951 Orthostatic hypotension: Secondary | ICD-10-CM | POA: Diagnosis not present

## 2015-02-10 DIAGNOSIS — R4189 Other symptoms and signs involving cognitive functions and awareness: Secondary | ICD-10-CM | POA: Diagnosis not present

## 2015-02-10 DIAGNOSIS — N3941 Urge incontinence: Secondary | ICD-10-CM | POA: Diagnosis not present

## 2015-04-07 ENCOUNTER — Other Ambulatory Visit: Payer: Self-pay | Admitting: Cardiovascular Disease

## 2015-04-10 NOTE — Discharge Summary (Signed)
PATIENT NAME:  [name], Tyden W MR#:  [id number] DATE OF BIRTH:  May 16, 1916  DATE OF ADMISSION:  06/05/2014  DATE OF DISCHARGE:  06/06/2014  For a detailed note, please look at the history and physical done on admission by me.   DIAGNOSES AT DISCHARGE:  1.  Acute on chronic renal failure secondary to dehydration.  2.  Dehydration due to poor p.o. intake.  3.  History of recurrent DVT and PE due to factor V Leiden mutation. 4.  Benign prostatic hypertrophy.  5.  Hyperlipidemia.   INSTRUCTIONS:  The patient is being discharged on a low-sodium, low-fat diet. Activity is as tolerated. The patient is being discharged with home health physical therapy and nursing services. The patient is to follow up with Dr. Ronna PolioJennifer Walker in the next 1 to 2 weeks.   DISCHARGE MEDICATIONS: Aspirin 81 mg b.i.d., omeprazole 20 mg daily, Coreg 3.125 mg b.i.d., atorvastatin 80 mg daily, Colace 100 mg b.i.d., Pradaxa 75 mg b.i.d., Avodart 0.5 mg Tuesday, Thursday, Saturday, and VESIcare 5 mg daily.   BRIEF HOSPITAL COURSE: This is a 81 year old male with medical problems as mentioned above, presented to the hospital due to weakness, lethargy, and noted to be in acute renal failure and dehydration.   1.  Acute on chronic renal failure. The patient's baseline creatinine is now 1.4 to 1.5. The patient presented to the hospital with a creatinine as high as 2. This was likely secondary to dehydration and poor p.o. intake. The patient was aggressively hydrated with IV fluids. His creatinine has improved, since then, he is currently more awake and alert, and less lethargic. He is therefore being discharged home. His kidney function further needs to be followed as an outpatient.   2.  Dehydration. This was secondary to the fact that patient had turned off his air conditioner at home, and he was not eating and drinking well. Therefore, he was noted to be dehydrated. He has  clinically  been hydrated with IV fluids, and is taking  p.o. well now.   3.  History of recurrent DVT and PE. This is secondary to factor V Leiden mutation. The patient is already on Pradaxa, but his Pradaxa was held as he was in acute renal failure. I did discuss with the family that due to his fluctuating renal function, his anticoagulation likely needs to be switched. His Pradaxa cannot be used if his GFR is less than 30. This is to be discussed with his cardiologist, Dr. Mariah MillingGollan, as an outpatient. Other options may be Eliquis, Xarelto, or even Coumadin.   4.  GERD. The patient was maintained on his Protonix, will resume that.   5.  History of coronary artery disease, status post bypass. The patient had no acute chest pain. He will continue his Coreg, statin, and aspirin.   6.  BPH. The patient had no evidence of urinary retention. He was discharged back on his Avodart. He is being arranged for home health nursing and physical therapy services.   Time spent  is 40 minutes.    ____________________________ Rolly PancakeVivek J. Cherlynn KaiserSainani, MD vjs:mr D: 06/06/2014 15:22:23 ET T: 06/06/2014 19:26:57 ET JOB#: [id number]  cc: Rolly PancakeVivek J. Cherlynn KaiserSainani, MD, <Dictator> Ginette PitmanJennifer A. Dan HumphreysWalker, MD  Houston SirenVIVEK J SAINANI MD ELECTRONICALLY SIGNED 06/16/2014 20:30

## 2015-04-10 NOTE — Consult Note (Signed)
Chief Complaint:  Subjective/Chief Complaint No complaints. Somewhat lethargic ever since patient received 1 dose of haldol yesterday for agitiation. LFT relatively stable.   VITAL SIGNS/ANCILLARY NOTES: **Vital Signs.:   10-Nov-15 08:06  Vital Signs Type Routine  Temperature Temperature (F) 98.4  Celsius 36.8  Temperature Source axillary  Pulse Pulse 92  Respirations Respirations 18  Systolic BP Systolic BP 440  Diastolic BP (mmHg) Diastolic BP (mmHg) 84  Mean BP 109  Pulse Ox % Pulse Ox % 92  Pulse Ox Activity Level  At rest  Oxygen Delivery Room Air/ 21 %   Brief Assessment:  GEN no acute distress   Cardiac Regular   Respiratory clear BS   Gastrointestinal Nontender ,NABS   Lab Results: Hepatic:  10-Nov-15 05:01   Bilirubin, Total 0.7  Alkaline Phosphatase  223 (46-116 NOTE: New Reference Range 07/07/14)  SGPT (ALT)  131 (14-63 NOTE: New Reference Range 07/07/14)  SGOT (AST)  93  Total Protein, Serum 6.4  Albumin, Serum  2.5  Routine Chem:  10-Nov-15 05:01   Glucose, Serum 99  BUN 17  Creatinine (comp)  1.45  Sodium, Serum 141  Potassium, Serum 4.2  Chloride, Serum  108  CO2, Serum 24  Calcium (Total), Serum  8.1  Osmolality (calc) 283  eGFR (African American)  58  eGFR (Non-African American)  48 (eGFR values <104mL/min/1.73 m2 may be an indication of chronic kidney disease (CKD). Calculated eGFR, using the MRDR Study equation, is useful in  patients with stable renal function. The eGFR calculation will not be reliable in acutely ill patients when serum creatinine is changing rapidly. It is not useful in patients on dialysis. The eGFR calculation may not be applicable to patients at the low and high extremes of body sizes, pregnant women, and vegetarians.)  Anion Gap 9  Routine Hem:  10-Nov-15 05:01   WBC (CBC) 6.3  RBC (CBC)  3.99  Hemoglobin (CBC)  12.7  Hematocrit (CBC)  37.3  Platelet Count (CBC) 208  MCV 93  MCH 31.8  MCHC 34.1  RDW  13.3  Neutrophil % 51.3  Lymphocyte % 30.3  Monocyte % 10.9  Eosinophil % 7.1  Basophil % 0.4  Neutrophil # 3.3  Lymphocyte # 1.9  Monocyte # 0.7  Eosinophil # 0.5  Basophil # 0.0 (Result(s) reported on 27 Oct 2014 at 05:23AM.)   Assessment/Plan:  Assessment/Plan:  Assessment Possible cholecystitis/cholangitis. Stable from GI point of view.   Plan Continue Abx for 7-10 day course. No ERCP planned. Will sign off. Hopefully, patient can be discharged soon when he becomes more alert. I will be out tomorrow. Pt can f/u with me in offfice in 1-2 weeks. Thanks   Electronic Signatures: Verdie Shire (MD)  (Signed [id number] 12:48)  Authored: Chief Complaint, VITAL SIGNS/ANCILLARY NOTES, Brief Assessment, Lab Results, Assessment/Plan   Last Updated: 10-Nov-15 12:48 by Verdie Shire (MD)

## 2015-04-10 NOTE — Consult Note (Signed)
Pt seen and examined. Full consult to follow. Pt with hx of PE on eliquis, who presented with nausea and vomiting and fall at home. Pt is very hard of hearing. Has early dementia. LFT abnormal with GB sludge and mild GB wall thickening. CBD itself is normal in diameter. LFT was normal in 6/15.Though patient has mild icterus, patient denies any abdominal pain. Abdomin is soft and nontender throughout. Talked to daughter at length about possible ERCP in case there is there is CBD stone/obstruction. Discussed potentila risks. Eliquis has to be held for several days. I believe he is a high risk for endoscopic procedures at this time. Recommend, monitering LFT with Abx coverage and hope that LFT will gradually improve over next few days. Will follow. Thanks.   Electronic Signatures: Lutricia Feilh, Angelo Prindle (MD) (Signed on 07-Nov-15 09:27)  Authored   Last Updated: 07-Nov-15 09:28 by Lutricia Feilh, Helane Briceno (MD)

## 2015-04-10 NOTE — H&P (Signed)
PATIENT NAME:  Travis [name], Travis [name] MR#:  [id number] DATE OF BIRTH:  [birth year]  DATE OF ADMISSION:  10/23/2014  PRIMARY PHYSICIAN:  Shelia MediaJennifer A Walker, MD, us  EMERGENCY ROOM PHYSICIAN:  Onnie BoerKevin A Paduchowski, MD   CHIEF COMPLAINT:  Nausea and vomiting.   HISTORY OF PRESENT ILLNESS: A 81 year old male lives alone by himself with care giver taking care of him around the clock, comes in because of generalized weakness. The patient's daughter brought him because of generalized weakness and nausea and vomiting last night and this morning, total 2 times since last night. According to the patient's daughter, patient had a fall yesterday morning. Unable to get to the door, so neighbors had to open the door and they found him on the floor and according to her description, the patient was lying on the floor yesterday morning at about 6 to 7 hours. Patient was seen by EMS and then he was alert, oriented, so EMS did not bring him here yesterday Ac> area, they said the patient usually is cared for by the home care sitters at home anusually he is in the bed all the time and eats little. Recently his p.o. intake has been poor and not eating much at all and they noticed his energy is decreased  In the ER patient had a workup done, and his workup is significant for elevated LFTs with cholelithiasis pattern. Patient does have mild right upper quadrant pain when ER doctor asked, but denies when I asked him, he denies any abdominal pain, had 2 episodes of nausea at home but none here. The patient has normal lipase levels at 144; patient had bloodwork done, CT of the head due to fall history. CT head did not show any hemorrhage and his white count is 7.4, urine is clear; patient's abdominal x-rays are normal. He had an abdominal ultrasound which showed gallbladder sludge and gallbladder wall thickening. Patient CBD duct diameter is 5.5 mm.   PAST MEDICAL HISTORY: Significant for history of PE, DVT, on Eliquis and he has history of  coronary artery disease, chronic renal failure with CKD stage III, history of hypertension, hyperlipidemia and also overactive bladder.   PAST SURGICAL HISTORY: Significant for bypass surgery and remote surgery for hernia.   ALLERGIES: PATIENT IS ALLERGIC TO PENICILLIN AND SULFA.   SOCIAL HISTORY: Lives alone. The patient has caregivers at home. No alcohol. No drugs.   FAMILY HISTORY: No history of hypertension or diabetes.   CURRENT MEDICATIONS: Atorvastatin 80 mg, half tablet daily; Avodart 0.5 mg p.o. daily, Colace 100 mg p.o. b.i.d., Eliquis 0.5 mg p.o. b.i.d., fludrocortisone 0.1 mg Monday, Wednesday, Friday, omeprazole 20 mg daily, VESIcare 5 mg p.o. daily.   REVIEW OF SYSTEMS: The patient is slightly confused unable to obtain full review of systems but family denies any diarrhea, recent illnesses but was feeling poor, decreased p.o. intake, he is sleeping all the time.   PHYSICAL EXAMINATION: VITAL SIGNS: Temperature 98.1, heart rate 87, blood pressure 153/79, sats 98% on room air.  GENERAL: Alert, awake, patient very hard of hearing.  HEAD: Normocephalic, atraumatic.  EYES: Pupils equally reacting to light. No conjunctival pallor.  NOSE: No nasal lesions. No drainage.  EARS: No drainage.  MOUTH: No lesions.  NECK: Supple. No JVD. No carotid bruit, normal range of motion.  RESPIRATORY: Clear to auscultation. No wheeze noted.  ABDOMEN: The patient has no right upper quadrant tenderness, bowel sounds present, no organomegaly, no hernias, no tenderness in any quadrants.  MUSCULOSKELETAL: The patient is  able to move all extremities, no joint effusion.  NEUROLOGIC: Cranial nerves II through XII intact, power 5/5 in upper and lower extremities,power 5/5 > bilaterally.   PSYCHIATRIC: Patient is slightly demented, very hard of hearing. Mood and affect are within normal limits.   DIAGNOSTIC DATA:   1.  Ultrasound of the abdomen shows diameter of common bile duct 5.5 mm; no focal lesions  in the liver; the patient has a prominent amount of gallbladder sludge in the gallbladder, gallbladder wall thickening is present.  2.  Abdominal x-ray shows no acute abnormality..  3.  Chest x-ray shows prior CABG and mild atelectasis in the bases.  4.  Urinalysis is clear.  5.  WBC 7.4, hemoglobin 12.6, hematocrit 38.2, platelets 197,000; electrolytes, sodium 142, potassium 3.7, chloride 103, bicarbonate 28, BUN 30, creatinine 1.65, glucose 96, AST 599, ALT 395, and alkaline phosphatase 297. The patient's albumin is 2.7, bilirubin is 3.9, patient's troponin less than 0.02; the INR is 1.2, lipase 144.  6.  Head CT shows no hemorrhage, negative for infarcts. The patient has moderate atrophy.  7. The patient's CT of the cervical spine shows advanced cervical degenerative disk disease, atrophy and chronic  microvascular ischemia.  8.  Chest x-ray shows left basilar opacity.   ASSESSMENT AND PLAN:  1.  The patient is a 81 year old male with fall with no CT evidence of hemorrhage; the fall likely secondary to dehydration and possibly early dementia. The patient will be admitted, will have physical therapy evaluation.  2.  Cholelithiasis with sludge in the gallbladder with elevated LFTs, patient has choledocholithiasis with elevated bilirubin and AST, ALT; at this time he has no pancreatitis or no cholangitis. He has no fever. White count is normal. Admit him to medical service. Start empiric antibiotics, IV fluids, IV pain medicines, and Zofran; check CBC, obtain GI consult for possible ERCP and surgical consult as well. Patient is at high risk for any intervention due to advanced age and has the risk factors of deep vein thrombosis pulmonary embolus.  3.  Chronic kidney disease stage III, patient has history of chronic renal failure. The patient's creatinine, he has a baseline at around 1.76, and GFR is about 32. Patient right now has nausea, vomiting episode at home, so we will continue fluids but his  kidney function is stable.  4.  Code status. Discussed with the family, they said they have a healthcare living will from patient, they do not want any heroic measures in case he becomes comatose or vegetative state. 5.  Patient's other diagnosis includes pulmonary emboli. He is on Eliquis at this time, hold Eliquis in case if he needs to have any intervention over the weekend.   TIME SPENT ON THIS ADMISSION:  Was 60 minutes.    ____________________________ Katha Hamming, MD sk:nt D: 10/23/2014 18:08:41 ET T: 10/23/2014 18:53:04 ET JOB#: [id number]  cc: Katha Hamming, MD, <Dictator> Katha Hamming MD ELECTRONICALLY SIGNED 11/29/2014 23:23

## 2015-04-10 NOTE — Discharge Summary (Signed)
PATIENT NAME:  Travis [name], [name] MR#:  [id number] DATE OF BIRTH:  1916/06/11  DATE OF ADMISSION:  10/23/2014 DATE OF DISCHARGE:  11/06/2014  Please see interim discharge summary dictated by Dr. Nemiah CommanderKalisetti on November 15th. The patient was discharged to home with home health PT.  FINAL DIAGNOSES: 1.  Altered mental status, likely due to side effects from Haldol, resolved.  2.  Parkinson disease.  3.  Cholelithiasis, medical treatment.  4.  Chronic kidney disease stage III. 5.  History of pulmonary embolus. 6.  Chronic hypotension, asymptomatic.  CODE STATUS: FULL code.   DISCHARGE MEDICATIONS: 1.  Omeprazole 20 mg daily.  2.  VESIcare 5 mg daily.  3.  Docusate 100 mg daily.  4.  Avodart 0.5 Monday, Wednesday and Friday.  5.  Fludrocortisone 0.1 mg Monday, Wednesday and Friday.  6.  Eliquis 5 mg 1/2 tablet that is 2.5 mg b.i.d.  7.  Carbidopa/levodopa 25/100 one tablet t.i.d.  8.  Clonazepam 0.5 mg daily at bedtime.  9.  Lactulose 30 mL b.i.d. as needed.   HOME HEALTH: PT and nurse aide.   DISCHARGE DIET: Regular.   DISCHARGE FOLLOWUP: Follow up with Dr. Ronna PolioJennifer Walker in 1 to 2 weeks.   DIAGNOSTIC DATA: Labs at discharge: Hemoglobin is 12.6. Glucose is 115, BUN 28, creatinine 1.58, sodium 135.   HOSPITAL COURSE: Please review interim discharge summary dictated by Dr. Nemiah CommanderKalisetti. Overall, the patient continued to improve as far as his mental status. He was back to his baseline. Repeat CT head negative for any acute changes. The patient was tolerating his Sinemet and amantadine well, which was started by Dr. Katrinka BlazingSmith. His creatinine stabilized, was down to 1.5. The patient was tolerating p.o. diet well and hydrating well as well. He was continued on Eliquis for his pulmonary emboli. Overall, the patient improved. Physical therapy recommended rehabilitation; however, the patient's daughters want to take the patient home with home health, which has been arranged. Hospital stay otherwise  remained stable. The patient remained a FULL code.   TIME SPENT: 40 minutes.  ____________________________ Wylie HailSona A. Allena KatzPatel, MD sap:sb D: 11/06/2014 07:53:00 ET T: 11/06/2014 14:26:19 ET JOB#: [id number]  cc: Contrina Orona A. Allena KatzPatel, MD, <Dictator> Ginette PitmanJennifer A. Dan HumphreysWalker, MD Troy SineMatthew C. Katrinka BlazingSmith, MD  Willow OraSONA A Vaneta Hammontree MD ELECTRONICALLY SIGNED 11/19/2014 14:06

## 2015-04-10 NOTE — Consult Note (Signed)
PATIENT NAME:  Travis [name], Travis [name] MR#:  [id number] DATE OF BIRTH:  03-06-1916  DATE OF CONSULTATION:  10/24/2014  REFERRING PHYSICIAN:   CONSULTING PHYSICIAN:  Ezzard StandingPaul Y. Nyaira Hodgens, MD  REASON FOR REFERRAL: Abnormal liver enzymes.   DESCRIPTION:  The patient is a 81 year old white male who lives alone who had been complaining of some increasing general weakness and nausea and vomiting. He fell at home and could not get up. He was brought in to the hospital by EMS. He says oral intake has been poor recently.   He is very hard of hearing, and he has some early dementia which makes it hard to obtain any history from the patient. The patient was noted to have abnormal liver enzymes with a bilirubin of 3.9, alkaline phosphatase 297, AST 599 and 395. Ultrasound showed some sludge in the gallbladder and some gallbladder wall thickening. There was a negative Murphy sign. There is pericholecystic fluid collection. The common bile duct was normal at 5.5 mm. A CT of the head and cervical spine showed no evidence of major trauma or bleed.   When I saw the patient, the patient had no complaints of abdominal pain whatsoever.   PAST MEDICAL HISTORY: Notable for a pulmonary embolism. The patient is on Eliquis as a result. Other history includes coronary artery disease, chronic renal failure and history of hypertension and hyperlipidemia.   PAST SURGICAL HISTORY: Includes coronary artery bypass surgery and hernia repair.   ALLERGIES: THE PATIENT IS ALLERGIC TO PENICILLIN AND SULFA DRUGS.   FAMILY HISTORY: Negative.   CURRENT MEDICATIONS: At home include Avodart daily; Colace twice a day; Eliquis 0.5 mg twice a day; atorvastatin 30 mg half a tablet daily; fludrocortisone on Monday, Wednesday and Friday; and omeprazole.   REVIEW OF SYSTEMS: Really cannot be obtained from the patient.   PHYSICAL EXAMINATION:  GENERAL: The patient is an elderly gentleman, in no acute distress.  VITAL SIGNS: He is afebrile. Vital signs are  stable.  HEENT: Normocephalic, atraumatic head. Pupils are equally reactive. Throat is clear.  NECK: Supple.  CARDIAC: Regular rhythm and rate without murmurs.  LUNGS: Clear bilaterally.  ABDOMEN: Shows normoactive bowel sounds, soft, nontender throughout. There is no hepatomegaly. There are no palpable masses. There is no rebound or guarding.  EXTREMITIES: Show no clubbing, cyanosis or edema.   ASSESSMENT AND PLAN: This is a patient with nausea, vomiting, weakness and abnormal liver enzymes. There is a possibility of stone or sludge. The liver enzymes are abnormal but no obvious evidence of common bile duct obstruction. The patient was started on empiric antibiotics which I agree. I was asked to evaluate the patient for an ERCP. I discussed the ERCP in detail with the daughter who was present. I also discussed the potential risks including bleeding from Eliquis, pancreatitis, reaction to sedation, etc. At this point, the patient is asymptomatic with a soft abdomen. I would not recommend an ERCP at this point. Because of his age and other comorbid illnesses, the patient would be at high risk for any complications. The Eliquis needs to be held for a minimum of 48 hours before any procedure or surgery can be contemplated. We will continue to monitor the liver enzymes and hope they will continue to improve over the next day or 2. If it does not improve, then I will discuss with the daughter again about what could be done including percutaneous drainage, ERCP or comfort care.   Thank you for the referral.   ____________________________ Ezzard StandingPaul Y. Travis Fulfer,  MD pyo:JT D: 10/25/2014 08:26:13 ET T: 10/25/2014 09:20:31 ET JOB#: [id number]  cc: Ezzard Standing. Bluford Kaufmann, MD, <Dictator> Ezzard Standing Dashawn Bartnick MD ELECTRONICALLY SIGNED 10/25/2014 11:15

## 2015-04-10 NOTE — Consult Note (Signed)
PATIENT NAME:  Travis [name], Travis [name] DATE OF BIRTH:  January 09, 1916  DATE OF CONSULTATION:  10/28/2014  CONSULTING PHYSICIAN:  Pauletta BrownsYuriy Barbee Mamula, MD  REASON FOR CONSULTATION:  Altered mental status.   HISTORY OF PRESENT ILLNESS:  Information is obtained from the patient's chart and the patient's family members who are currently at bedside. This is a 81 year old male who at baseline lives alone by himself. He gets assistance with caretaker only a few hours a day. He is able to feed himself, cook for himself, and to take care of his basic needs. He presented status post fall with elevated LFTs and suspicion of cholecystitis. The patient was deemed not to be a surgical candidate. The patient was started on antibiotics. The patient also has a chronic history of PE and DVT and has been on Eliquis. Mental status has deteriorated post admission. The patient received Haldol. For the past 2 days, mental status has been deteriorating.  PAST MEDICAL HISTORY:  Significant for a history of PE and DVT on Eliquis, coronary artery disease, chronic renal failure stage III, history of hypertension, hyperlipidemia.   PAST SURGICAL HISTORY:  Significant for bypass surgery.   ALLERGIES: INCLUDE PENICILLIN AND SULFA.   FAMILY HISTORY:  Noncontributory.   HOME MEDICATIONS:  Have been reviewed.   REVIEW OF SYSTEMS:  Unable to obtain at this point.   PHYSICAL EXAMINATION: VITAL SIGNS:  Include a temperature of 97.7, pulse is 96, respirations 18, and blood pressure 122/71.  NEUROLOGICAL:  The patient is able to follow simple commands, opens his eyes and closes his eyes to commands and sticks out his tongue, which reveals it elevates symmetrically. Shoulder shrug is intact. No facial asymmetry is noted. Motor strength appears to be generalized weakness, probably 4/5, in the bilateral upper and lower extremities. Sensation appears to be intact. Reflexes are slightly diminished. Coordination is hard to assess, as the  patient does not want to follow command. Gait cannot be assessed.   IMPRESSION:  A 81 year old gentleman who lives alone at baseline, who presents with weakness, suspected to have gallbladder infection versus common bile duct infection. He is started on antibiotics. Altered mental status is likely delirium throughout the hospital stay. The patient is status post fall and Haldol 2 days ago and now has what appears to be dopamine blockage induced extrapyramidal symptoms such as dystonia and acinesia. His imaging has been reviewed.   PLAN:  I think this is neuroleptic-induced extrapyramidal symptoms probably to dopamine blockage even though it is very rare to have these symptoms post only one dose of Haldol, but it has been seen in the literature. I started the patient on Cogentin 1 mg twice a day. His symptoms should improve. Continue hydration. His mental status is actually slightly better this morning. He was able to tolerate some pureed food that was given by his family. Significant time was spent discussing the case with his family. I agree with obtaining an EEG in the morning, which has been ordered. CAT scan of the head has been reviewed.   Thank you. It was a pleasure seeing this patient. Please call with any questions.    ____________________________ Pauletta BrownsYuriy Takina Busser, MD yz:nb D: 10/28/2014 14:13:08 ET T: 10/28/2014 17:16:13 ET JOB#: [id number]  cc: Pauletta BrownsYuriy Cletus Mehlhoff, MD, <Dictator> Pauletta BrownsYURIY Kharee Lesesne MD ELECTRONICALLY SIGNED 11/16/2014 13:39

## 2015-04-10 NOTE — H&P (Signed)
PATIENT NAME:  Travis [name], Travis [name] MR#:  [id number] DATE OF BIRTH:  1916/08/06  DATE OF ADMISSION:  06/05/2014  PRIMARY CARE PHYSICIAN: Dr. Ronna PolioJennifer Walker.   CHIEF COMPLAINT: Weakness, lethargy.   HISTORY OF PRESENT ILLNESS: This is a 81 year old male who presents to the Emergency Room due to increasing weakness, lethargy, and having difficulty ambulating. The patient normally lives by himself, does all his activities of daily living. When daughter came to see him today, his air conditioning was turned off. She attempted to get her father up from the couch, but he was having difficulty putting weight on his legs and difficulty ambulating. She called the patient's primary care physician, who then advised to bring the patient to the Emergency Room. In the Emergency Room, the patient was noted to be in acute renal failure with a creatinine as high as over 2. He was given a liter of fluid, but still appears to be somewhat sluggish and lethargic and therefore, hospitalist services were contacted for further treatment and evaluation.   REVIEW OF SYSTEMS:    CONSTITUTIONAL: No documented fever. No weight gain. No weight loss. Positive weakness.  EYES: No blurry or double vision.  EARS, NOSE, THROAT: No tinnitus. No postnasal drip. No redness of the oropharynx.  RESPIRATORY: No cough, no wheeze, no hemoptysis, no dyspnea.  CARDIOVASCULAR: No chest pain, no orthopnea, no palpitations, no syncope.  GASTROINTESTINAL: No nausea, no vomiting. No diarrhea. No abdominal pain. No melena or hematochezia.  GENITOURINARY: No dysuria or hematuria.  ENDOCRINE: No polyuria, nocturia, heat or cold intolerance.  HEMATOLOGIC: No anemia, no bruising, no bleeding.  INTEGUMENTARY: No rashes. No lesions.  MUSCULOSKELETAL: No arthritis, no swelling, no gout.  NEUROLOGIC: No numbness. No tingling. No ataxia. No seizure-type activity.  PSYCHIATRIC: No anxiety. No insomnia. No ADD.   PAST MEDICAL HISTORY: Consistent with BPH;  hypertension; history of coronary artery disease, status post bypass; hyperlipidemia; GERD; history of recurrent DVT, PE due to factor V Leiden mutation.   ALLERGIES: PENICILLIN AND SULFA DRUGS.   SOCIAL HISTORY: No smoking. No alcohol abuse. No illicit drug abuse. Lives by himself.   FAMILY HISTORY: No significant family history of coronary artery disease or diabetes.   CURRENT MEDICATIONS: As follows: Aspirin 81 mg twice daily, atorvastatin 80 mg daily, Avodart 0.5 mg Tuesday, Thursday, Saturday; Coreg 3.125 mg b.i.d., Colace 100 mg b.i.d., omeprazole 20 mg daily, Pradaxa 75 mg b.i.d., and VESIcare 5 mg daily.   PHYSICAL EXAMINATION: Presently is as follows:  VITAL SIGNS: Temperature is 98.2, pulse 76, respirations 20, blood pressure 115/73, sats 96% on room air.  GENERAL: He is a pleasant-appearing male in no apparent distress.  HEAD, EYES, EARS, NOSE, THROAT: He is atraumatic, normocephalic. His extraocular muscles are intact. Pupils are equal and reactive to light. Sclerae anicteric. No conjunctival injection. No pharyngeal erythema.  NECK: Supple. There is no jugular venous distention. No bruits, no lymphadenopathy, no thyromegaly.  HEART: Regular rate and rhythm. No murmurs. No rubs. No clicks.  LUNGS: Clear to auscultation bilaterally. No rales. No rhonchi. No wheezes.  ABDOMEN: Soft, flat, nontender, nondistended. Has good bowel sounds. No hepatosplenomegaly appreciated.  EXTREMITIES: No evidence of any cyanosis, clubbing, or peripheral edema. Has +2 pedal and radial pulses bilaterally.  NEUROLOGICAL: The patient is alert, awake, and oriented x 3 with no focal motor or sensory deficits appreciated bilaterally.  SKIN: Moist, warm, with no rashes appreciated.  LYMPHATIC: There is no cervical or axillary lymphadenopathy.   LABORATORY AND RADIOLOGICAL DATA: Serum glucose  of 86, BUN 37, creatinine 2.08, sodium 135, potassium 3.7, chloride 104, bicarbonate 24. The patient's LFTs are within  normal limits. Troponin less than 0.02. White cell count 12.4, hemoglobin 13.0, hematocrit 38.5, platelet count 165. The patient's urinalysis was within normal limits.   The patient did have a chest x-ray done, which showed no acute abnormalities.   ASSESSMENT AND PLAN: This is a 81 year old male with a history of benign prostatic hypertrophy; hypertension; history of coronary artery disease, status post bypass; hyperlipidemia; gastroesophageal reflux disease; history of recurrent deep vein thrombosis, pulmonary embolism due to factor V Leiden mutation, presented to the hospital due to increasing weakness and lethargy and difficulty walking, noted to be dehydrated and in acute renal failure.  1.  Acute on chronic renal failure: The patient's baseline creatinine is around 1.4, currently elevated at 2.0. This is likely secondary to prerenal azotemia and dehydration. The patient apparently had his air conditioning cut off at home and was dehydrated. I will aggressively hydrate him with IV fluids, follow his BUN and creatinine, renal dose meds, avoid nephrotoxins.  2.  Dehydration: This is likely the cause of the patient's acute renal failure. This is secondary to him having his air conditioning turned off and not adequately hydrating himself. I will give him IV fluids and follow him clinically.  3.  History of recurrent deep vein thrombosis and pulmonary embolism secondary to factor V Leiden mutation: Continue his Pradaxa.  4.  Gastroesophageal reflux disease: Continue Protonix.  5.  History of coronary artery disease, status post bypass: No acute chest pain. Continue Coreg, aspirin, and statin.  6.  Benign prostatic hypertrophy: Continue Avodart.   CODE STATUS: The patient is a full code.   TIME SPENT ON ADMISSION: 45 minutes.    ____________________________ Rolly Pancake. Cherlynn Kaiser, MD vjs:jcm D: 06/05/2014 19:06:13 ET T: 06/05/2014 19:49:03 ET JOB#: [id number]  cc: Rolly Pancake. Cherlynn Kaiser, MD, <Dictator> Houston Siren MD ELECTRONICALLY SIGNED 06/16/2014 20:29

## 2015-04-10 NOTE — Consult Note (Signed)
Chief Complaint:  Subjective/Chief Complaint No complaints. Hard to get any hx due to pt's bad hearing.   VITAL SIGNS/ANCILLARY NOTES: **Vital Signs.:   08-Nov-15 04:30  Vital Signs Type Q 8hr  Temperature Temperature (F) 97.5  Celsius 36.3  Temperature Source oral  Pulse Pulse 86  Pulse source if not from Vital Sign Device radial  Respirations Respirations 23  Systolic BP Systolic BP 202  Diastolic BP (mmHg) Diastolic BP (mmHg) 79  Mean BP 104  Pulse Ox % Pulse Ox % 96  Pulse Ox Activity Level  At rest  Oxygen Delivery Room Air/ 21 %   Brief Assessment:  GEN no acute distress   Cardiac Regular   Respiratory clear BS   Gastrointestinal details normal Soft  Nontender  Nondistended  Bowel sounds normal  No organomegaly   Lab Results: Hepatic:  08-Nov-15 04:48   Bilirubin, Total 1.0  Alkaline Phosphatase  241 (46-116 NOTE: New Reference Range 07/07/14)  SGPT (ALT)  214 (14-63 NOTE: New Reference Range 07/07/14)  SGOT (AST)  205  Total Protein, Serum  5.9  Albumin, Serum  2.3  Routine Chem:  08-Nov-15 04:48   BUN 17  Creatinine (comp)  1.39  Sodium, Serum 142  Potassium, Serum 3.5  Chloride, Serum  109  CO2, Serum 25  Calcium (Total), Serum  7.3  Osmolality (calc) 284  eGFR (African American) >60  eGFR (Non-African American)  50 (eGFR values <35mL/min/1.73 m2 may be an indication of chronic kidney disease (CKD). Calculated eGFR, using the MRDR Study equation, is useful in  patients with stable renal function. The eGFR calculation will not be reliable in acutely ill patients when serum creatinine is changing rapidly. It is not useful in patients on dialysis. The eGFR calculation may not be applicable to patients at the low and high extremes of body sizes, pregnant women, and vegetarians.)  Anion Gap 8   Assessment/Plan:  Assessment/Plan:  Assessment LFT elevation with possible cholecystitis. Clinically improving. LFT improving. T.bili normal today.    Plan Continue conservative measures with Abx. No plans for ERCP. Will follow. Thanks.   Electronic Signatures: Verdie Shire (MD)  (Signed [phone] 10:11)  Authored: Chief Complaint, VITAL SIGNS/ANCILLARY NOTES, Brief Assessment, Lab Results, Assessment/Plan   Last Updated: 08-Nov-15 10:11 by Verdie Shire (MD)

## 2015-04-11 NOTE — Discharge Summary (Signed)
PATIENT NAME:  Travis [name], Travis [name] MR#:  [id number] DATE OF BIRTH:  March 27, 1916  DATE OF ADMISSION:  12/11/2011 DATE OF DISCHARGE:  12/16/2011  REFERRING PHYSICIAN: Dr. Darnelle CatalanMalinda.   PRIMARY CARE PHYSICIAN:  None.   CARDIOLOGIST: Dr. Mariah MillingGollan.   ADMITTING DIAGNOSIS: Left lower extremity swelling.   ADMITTING PHYSICIAN: Dr. Krystal EatonShayiq Ahmadzia.     CONSULTANTS:  1. Case management.   2. PT/OT.   TESTS DONE IN THIS HOSPITALIZATION: Doppler lower extremity 12/11/2011 showed near complete occlusive thrombus involving venous structures, left lower extremity.   HOSPITAL COURSE: Initial history and physical were done by Dr. Jacques NavyAhmadzia.  Please refer to his note dated 12/11/2011 for complete details.  In brief this is a 81 year old independent white male with past medical history of PE in 2006, history of coronary artery disease, status post coronary artery bypass graft, as per daughter factor V deficiency probably, who presents with lower extremity swelling past 2 weeks. The patient was found to have deep vein thrombosis.  1. For his lower extremity deep venous thrombosis, the patient was started on heparin and Coumadin. His INR subsequently was 1.7 then 2.1 on discharge. He probably will need to be on lifelong anticoagulation. Unfortunately we were unable to do factor V Leiden.  Patient has a history of factor V Leiden mutation, probably will need to be on lifelong Coumadin.  2. Renal failure. Chronic kidney disease stage III.  The patient's creatinine was improving and was at baseline/creatinine 06/2011 was 1.8. This improved with hydration.   3. Coronary artery disease. The patient was continued on aspirin, statin, and beta blocker.  4. Thrombocytopenia. We monitored while the patient was on platelets.  This improved.  5. Debilitation.  The patient was evaluated by PT/OT and recommended for home health.  6. Deep vein thrombosis prophylaxis maintained with Coumadin and heparin.   CODE STATUS: Full code.   The  patient was set up for outpatient followup with Dr. Ronna PolioJennifer Walker.  The patient was discharged on 12/16/2011.   PHYSICAL EXAMINATION:    VITAL SIGNS:  Temperature 97.3, heart rate 82, respirations 18, blood pressure 101/67, saturating 97% on room air.    LUNGS: Clear to auscultation.   CARDIOVASCULAR: Regular rate and rhythm.   ABDOMEN: Benign.   DISCHARGE MEDICATIONS:  The patient was discharged on the following medications:  1. Aspirin 81 mg 2 times a day.   2. Omeprazole 20 mg daily.  3. Carvedilol 3.125 mg b.i.d.  4. Atorvastatin 80 mg daily.  5. Cetirizine 10 mg daily.  6. Docusate 100 mg 2 times a day.  7. Hydroxyzine 2 tablets 2 times a day.  8. Coumadin 5 mg daily.  May need to decrease based on INR.   PATIENT'S ACTIVITY: As tolerated but be careful as the patient is on Coumadin. Home PT.    FOLLOWUP: The patient will see Dr Mariah MillingGollan on 12/29/2011, Dr. Ronna PolioJennifer Walker 01/09/2012.  Patient should get PT/INR checked in 1 to 2 days, fax results to Dr. Mariah MillingGollan.   TOTAL TIME SPENT ON DISCHARGE: 45 minutes.  Thank you for allowing me to participate in the care of this patient.   ____________________________ Corie ChiquitoAmir A. [place] DragonFirozvi, MD aaf:vtd D: 12/17/2011 09:07:57 ET   T: 12/20/2011 12:27:22 ET   JOB#: [id number] cc: Karolee OhsAmir A. [place] DragonFirozvi, MD, <Dictator> Antonieta Ibaimothy J. Gollan, MD; Ginette PitmanJennifer A. Dan HumphreysWalker, MD Coralyn PearAMIR A Harmoni Lucus MD ELECTRONICALLY SIGNED 12/21/2011 13:05

## 2015-05-05 DIAGNOSIS — R600 Localized edema: Secondary | ICD-10-CM | POA: Diagnosis not present

## 2015-05-05 DIAGNOSIS — I951 Orthostatic hypotension: Secondary | ICD-10-CM | POA: Diagnosis not present

## 2015-05-05 DIAGNOSIS — I2583 Coronary atherosclerosis due to lipid rich plaque: Secondary | ICD-10-CM | POA: Diagnosis not present

## 2015-05-05 DIAGNOSIS — Z7409 Other reduced mobility: Secondary | ICD-10-CM | POA: Diagnosis not present

## 2015-05-05 DIAGNOSIS — I2510 Atherosclerotic heart disease of native coronary artery without angina pectoris: Secondary | ICD-10-CM | POA: Diagnosis not present

## 2015-05-05 DIAGNOSIS — R4189 Other symptoms and signs involving cognitive functions and awareness: Secondary | ICD-10-CM | POA: Diagnosis not present

## 2015-05-14 ENCOUNTER — Encounter: Payer: Self-pay | Admitting: Internal Medicine

## 2015-05-14 ENCOUNTER — Encounter: Payer: Self-pay | Admitting: Cardiovascular Disease

## 2015-05-31 DIAGNOSIS — R918 Other nonspecific abnormal finding of lung field: Secondary | ICD-10-CM | POA: Diagnosis not present

## 2015-05-31 DIAGNOSIS — J209 Acute bronchitis, unspecified: Secondary | ICD-10-CM | POA: Diagnosis not present

## 2015-05-31 DIAGNOSIS — I482 Chronic atrial fibrillation: Secondary | ICD-10-CM | POA: Diagnosis not present

## 2015-05-31 DIAGNOSIS — I2510 Atherosclerotic heart disease of native coronary artery without angina pectoris: Secondary | ICD-10-CM | POA: Diagnosis not present

## 2015-05-31 DIAGNOSIS — I959 Hypotension, unspecified: Secondary | ICD-10-CM | POA: Diagnosis not present

## 2015-05-31 DIAGNOSIS — D682 Hereditary deficiency of other clotting factors: Secondary | ICD-10-CM | POA: Diagnosis not present

## 2015-05-31 DIAGNOSIS — F0390 Unspecified dementia without behavioral disturbance: Secondary | ICD-10-CM | POA: Diagnosis not present

## 2015-05-31 DIAGNOSIS — Z7901 Long term (current) use of anticoagulants: Secondary | ICD-10-CM | POA: Diagnosis not present

## 2015-05-31 DIAGNOSIS — Z87438 Personal history of other diseases of male genital organs: Secondary | ICD-10-CM | POA: Diagnosis not present

## 2015-05-31 DIAGNOSIS — Z66 Do not resuscitate: Secondary | ICD-10-CM | POA: Diagnosis not present

## 2015-05-31 DIAGNOSIS — R05 Cough: Secondary | ICD-10-CM | POA: Diagnosis not present

## 2015-05-31 DIAGNOSIS — R0602 Shortness of breath: Secondary | ICD-10-CM | POA: Diagnosis not present

## 2015-06-01 DIAGNOSIS — J209 Acute bronchitis, unspecified: Secondary | ICD-10-CM | POA: Diagnosis not present

## 2015-06-01 DIAGNOSIS — D682 Hereditary deficiency of other clotting factors: Secondary | ICD-10-CM | POA: Diagnosis not present

## 2015-06-01 DIAGNOSIS — R0602 Shortness of breath: Secondary | ICD-10-CM | POA: Diagnosis not present

## 2015-06-01 DIAGNOSIS — I4510 Unspecified right bundle-branch block: Secondary | ICD-10-CM | POA: Diagnosis not present

## 2015-06-01 DIAGNOSIS — R05 Cough: Secondary | ICD-10-CM | POA: Diagnosis not present

## 2015-06-01 DIAGNOSIS — I482 Chronic atrial fibrillation: Secondary | ICD-10-CM | POA: Diagnosis not present

## 2015-06-01 DIAGNOSIS — I2510 Atherosclerotic heart disease of native coronary artery without angina pectoris: Secondary | ICD-10-CM | POA: Diagnosis not present

## 2015-06-01 DIAGNOSIS — Z87438 Personal history of other diseases of male genital organs: Secondary | ICD-10-CM | POA: Diagnosis not present

## 2015-06-01 DIAGNOSIS — Z66 Do not resuscitate: Secondary | ICD-10-CM | POA: Diagnosis not present

## 2015-06-01 DIAGNOSIS — R918 Other nonspecific abnormal finding of lung field: Secondary | ICD-10-CM | POA: Diagnosis not present

## 2015-06-01 DIAGNOSIS — F0390 Unspecified dementia without behavioral disturbance: Secondary | ICD-10-CM | POA: Diagnosis not present

## 2015-06-01 DIAGNOSIS — I959 Hypotension, unspecified: Secondary | ICD-10-CM | POA: Diagnosis not present

## 2015-06-01 DIAGNOSIS — R9431 Abnormal electrocardiogram [ECG] [EKG]: Secondary | ICD-10-CM | POA: Diagnosis not present

## 2015-06-01 DIAGNOSIS — R0600 Dyspnea, unspecified: Secondary | ICD-10-CM | POA: Diagnosis not present

## 2015-06-01 DIAGNOSIS — M47812 Spondylosis without myelopathy or radiculopathy, cervical region: Secondary | ICD-10-CM | POA: Diagnosis not present

## 2015-06-01 DIAGNOSIS — Z7901 Long term (current) use of anticoagulants: Secondary | ICD-10-CM | POA: Diagnosis not present

## 2015-06-02 DIAGNOSIS — Z66 Do not resuscitate: Secondary | ICD-10-CM | POA: Diagnosis not present

## 2015-06-02 DIAGNOSIS — J209 Acute bronchitis, unspecified: Secondary | ICD-10-CM | POA: Diagnosis not present

## 2015-06-02 DIAGNOSIS — I2510 Atherosclerotic heart disease of native coronary artery without angina pectoris: Secondary | ICD-10-CM | POA: Diagnosis not present

## 2015-06-02 DIAGNOSIS — R0600 Dyspnea, unspecified: Secondary | ICD-10-CM | POA: Diagnosis not present

## 2015-06-02 DIAGNOSIS — R0602 Shortness of breath: Secondary | ICD-10-CM | POA: Diagnosis not present

## 2015-06-02 DIAGNOSIS — Z87438 Personal history of other diseases of male genital organs: Secondary | ICD-10-CM | POA: Diagnosis not present

## 2015-06-02 DIAGNOSIS — I482 Chronic atrial fibrillation: Secondary | ICD-10-CM | POA: Diagnosis not present

## 2015-06-02 DIAGNOSIS — D682 Hereditary deficiency of other clotting factors: Secondary | ICD-10-CM | POA: Diagnosis not present

## 2015-06-02 DIAGNOSIS — Z7901 Long term (current) use of anticoagulants: Secondary | ICD-10-CM | POA: Diagnosis not present

## 2015-06-02 DIAGNOSIS — I959 Hypotension, unspecified: Secondary | ICD-10-CM | POA: Diagnosis not present

## 2015-06-02 DIAGNOSIS — F0390 Unspecified dementia without behavioral disturbance: Secondary | ICD-10-CM | POA: Diagnosis not present

## 2015-06-11 DIAGNOSIS — I10 Essential (primary) hypertension: Secondary | ICD-10-CM | POA: Diagnosis not present

## 2015-06-11 DIAGNOSIS — Z7901 Long term (current) use of anticoagulants: Secondary | ICD-10-CM | POA: Diagnosis not present

## 2015-06-11 DIAGNOSIS — R932 Abnormal findings on diagnostic imaging of liver and biliary tract: Secondary | ICD-10-CM | POA: Diagnosis not present

## 2015-06-11 DIAGNOSIS — R1110 Vomiting, unspecified: Secondary | ICD-10-CM | POA: Diagnosis not present

## 2015-06-11 DIAGNOSIS — I959 Hypotension, unspecified: Secondary | ICD-10-CM | POA: Diagnosis not present

## 2015-06-11 DIAGNOSIS — Z4659 Encounter for fitting and adjustment of other gastrointestinal appliance and device: Secondary | ICD-10-CM | POA: Diagnosis not present

## 2015-06-11 DIAGNOSIS — J40 Bronchitis, not specified as acute or chronic: Secondary | ICD-10-CM | POA: Diagnosis not present

## 2015-06-11 DIAGNOSIS — Z01810 Encounter for preprocedural cardiovascular examination: Secondary | ICD-10-CM | POA: Diagnosis not present

## 2015-06-11 DIAGNOSIS — K8050 Calculus of bile duct without cholangitis or cholecystitis without obstruction: Secondary | ICD-10-CM | POA: Diagnosis not present

## 2015-06-11 DIAGNOSIS — K8047 Calculus of bile duct with acute and chronic cholecystitis with obstruction: Secondary | ICD-10-CM | POA: Diagnosis not present

## 2015-06-11 DIAGNOSIS — K829 Disease of gallbladder, unspecified: Secondary | ICD-10-CM | POA: Diagnosis not present

## 2015-06-11 DIAGNOSIS — R1011 Right upper quadrant pain: Secondary | ICD-10-CM | POA: Diagnosis not present

## 2015-06-11 DIAGNOSIS — J952 Acute pulmonary insufficiency following nonthoracic surgery: Secondary | ICD-10-CM | POA: Diagnosis not present

## 2015-06-11 DIAGNOSIS — R918 Other nonspecific abnormal finding of lung field: Secondary | ICD-10-CM | POA: Diagnosis not present

## 2015-06-11 DIAGNOSIS — B179 Acute viral hepatitis, unspecified: Secondary | ICD-10-CM | POA: Diagnosis not present

## 2015-06-11 DIAGNOSIS — J9811 Atelectasis: Secondary | ICD-10-CM | POA: Diagnosis not present

## 2015-06-11 DIAGNOSIS — I6782 Cerebral ischemia: Secondary | ICD-10-CM | POA: Diagnosis not present

## 2015-06-11 DIAGNOSIS — J189 Pneumonia, unspecified organism: Secondary | ICD-10-CM | POA: Diagnosis not present

## 2015-06-11 DIAGNOSIS — K8020 Calculus of gallbladder without cholecystitis without obstruction: Secondary | ICD-10-CM | POA: Diagnosis not present

## 2015-06-11 DIAGNOSIS — K819 Cholecystitis, unspecified: Secondary | ICD-10-CM | POA: Diagnosis not present

## 2015-06-11 DIAGNOSIS — J90 Pleural effusion, not elsewhere classified: Secondary | ICD-10-CM | POA: Diagnosis not present

## 2015-06-11 DIAGNOSIS — G4751 Confusional arousals: Secondary | ICD-10-CM | POA: Diagnosis not present

## 2015-06-11 DIAGNOSIS — R740 Nonspecific elevation of levels of transaminase and lactic acid dehydrogenase [LDH]: Secondary | ICD-10-CM | POA: Diagnosis not present

## 2015-06-11 DIAGNOSIS — F0390 Unspecified dementia without behavioral disturbance: Secondary | ICD-10-CM | POA: Diagnosis not present

## 2015-06-11 DIAGNOSIS — Z951 Presence of aortocoronary bypass graft: Secondary | ICD-10-CM | POA: Diagnosis not present

## 2015-06-11 DIAGNOSIS — R001 Bradycardia, unspecified: Secondary | ICD-10-CM | POA: Diagnosis not present

## 2015-06-11 DIAGNOSIS — K8013 Calculus of gallbladder with acute and chronic cholecystitis with obstruction: Secondary | ICD-10-CM | POA: Diagnosis not present

## 2015-06-11 DIAGNOSIS — R42 Dizziness and giddiness: Secondary | ICD-10-CM | POA: Diagnosis not present

## 2015-06-11 DIAGNOSIS — R000 Tachycardia, unspecified: Secondary | ICD-10-CM | POA: Diagnosis not present

## 2015-06-11 DIAGNOSIS — G8918 Other acute postprocedural pain: Secondary | ICD-10-CM | POA: Diagnosis not present

## 2015-06-11 DIAGNOSIS — G319 Degenerative disease of nervous system, unspecified: Secondary | ICD-10-CM | POA: Diagnosis not present

## 2015-06-11 DIAGNOSIS — R410 Disorientation, unspecified: Secondary | ICD-10-CM | POA: Diagnosis not present

## 2015-06-11 DIAGNOSIS — I2510 Atherosclerotic heart disease of native coronary artery without angina pectoris: Secondary | ICD-10-CM | POA: Diagnosis not present

## 2015-06-11 DIAGNOSIS — R05 Cough: Secondary | ICD-10-CM | POA: Diagnosis not present

## 2015-06-11 DIAGNOSIS — Z4682 Encounter for fitting and adjustment of non-vascular catheter: Secondary | ICD-10-CM | POA: Diagnosis not present

## 2015-06-11 DIAGNOSIS — N183 Chronic kidney disease, stage 3 (moderate): Secondary | ICD-10-CM | POA: Diagnosis not present

## 2015-06-11 DIAGNOSIS — D6851 Activated protein C resistance: Secondary | ICD-10-CM | POA: Diagnosis not present

## 2015-06-11 DIAGNOSIS — K810 Acute cholecystitis: Secondary | ICD-10-CM | POA: Diagnosis not present

## 2015-06-11 DIAGNOSIS — K8031 Calculus of bile duct with cholangitis, unspecified, with obstruction: Secondary | ICD-10-CM | POA: Diagnosis not present

## 2015-06-11 DIAGNOSIS — I4510 Unspecified right bundle-branch block: Secondary | ICD-10-CM | POA: Diagnosis not present

## 2015-06-11 DIAGNOSIS — I4519 Other right bundle-branch block: Secondary | ICD-10-CM | POA: Diagnosis not present

## 2015-06-11 DIAGNOSIS — K830 Cholangitis: Secondary | ICD-10-CM | POA: Diagnosis not present

## 2015-06-11 DIAGNOSIS — R0602 Shortness of breath: Secondary | ICD-10-CM | POA: Diagnosis not present

## 2015-06-11 DIAGNOSIS — R079 Chest pain, unspecified: Secondary | ICD-10-CM | POA: Diagnosis not present

## 2015-06-11 DIAGNOSIS — R531 Weakness: Secondary | ICD-10-CM | POA: Diagnosis not present

## 2015-06-11 DIAGNOSIS — R4182 Altered mental status, unspecified: Secondary | ICD-10-CM | POA: Diagnosis not present

## 2015-06-11 DIAGNOSIS — K567 Ileus, unspecified: Secondary | ICD-10-CM | POA: Diagnosis not present

## 2015-06-11 DIAGNOSIS — R17 Unspecified jaundice: Secondary | ICD-10-CM | POA: Diagnosis not present

## 2015-06-12 DIAGNOSIS — J189 Pneumonia, unspecified organism: Secondary | ICD-10-CM | POA: Diagnosis not present

## 2015-06-12 DIAGNOSIS — K8050 Calculus of bile duct without cholangitis or cholecystitis without obstruction: Secondary | ICD-10-CM | POA: Diagnosis not present

## 2015-06-12 DIAGNOSIS — R17 Unspecified jaundice: Secondary | ICD-10-CM | POA: Diagnosis not present

## 2015-06-12 DIAGNOSIS — R05 Cough: Secondary | ICD-10-CM | POA: Diagnosis not present

## 2015-06-12 DIAGNOSIS — B179 Acute viral hepatitis, unspecified: Secondary | ICD-10-CM | POA: Diagnosis not present

## 2015-06-12 DIAGNOSIS — J40 Bronchitis, not specified as acute or chronic: Secondary | ICD-10-CM | POA: Diagnosis not present

## 2015-06-12 DIAGNOSIS — R410 Disorientation, unspecified: Secondary | ICD-10-CM | POA: Diagnosis not present

## 2015-06-13 DIAGNOSIS — J90 Pleural effusion, not elsewhere classified: Secondary | ICD-10-CM | POA: Diagnosis not present

## 2015-06-13 DIAGNOSIS — R410 Disorientation, unspecified: Secondary | ICD-10-CM | POA: Diagnosis not present

## 2015-06-13 DIAGNOSIS — K8050 Calculus of bile duct without cholangitis or cholecystitis without obstruction: Secondary | ICD-10-CM | POA: Diagnosis not present

## 2015-06-13 DIAGNOSIS — R0602 Shortness of breath: Secondary | ICD-10-CM | POA: Diagnosis not present

## 2015-06-13 DIAGNOSIS — J40 Bronchitis, not specified as acute or chronic: Secondary | ICD-10-CM | POA: Diagnosis not present

## 2015-06-13 DIAGNOSIS — Z4682 Encounter for fitting and adjustment of non-vascular catheter: Secondary | ICD-10-CM | POA: Diagnosis not present

## 2015-06-13 DIAGNOSIS — J189 Pneumonia, unspecified organism: Secondary | ICD-10-CM | POA: Diagnosis not present

## 2015-06-13 DIAGNOSIS — J9811 Atelectasis: Secondary | ICD-10-CM | POA: Diagnosis not present

## 2015-06-13 DIAGNOSIS — R17 Unspecified jaundice: Secondary | ICD-10-CM | POA: Diagnosis not present

## 2015-06-13 DIAGNOSIS — B179 Acute viral hepatitis, unspecified: Secondary | ICD-10-CM | POA: Diagnosis not present

## 2015-06-14 DIAGNOSIS — R410 Disorientation, unspecified: Secondary | ICD-10-CM | POA: Diagnosis not present

## 2015-06-14 DIAGNOSIS — K819 Cholecystitis, unspecified: Secondary | ICD-10-CM | POA: Diagnosis not present

## 2015-06-14 DIAGNOSIS — B179 Acute viral hepatitis, unspecified: Secondary | ICD-10-CM | POA: Diagnosis not present

## 2015-06-14 DIAGNOSIS — K830 Cholangitis: Secondary | ICD-10-CM | POA: Diagnosis not present

## 2015-06-14 DIAGNOSIS — R17 Unspecified jaundice: Secondary | ICD-10-CM | POA: Diagnosis not present

## 2015-06-14 DIAGNOSIS — J40 Bronchitis, not specified as acute or chronic: Secondary | ICD-10-CM | POA: Diagnosis not present

## 2015-06-14 DIAGNOSIS — K8050 Calculus of bile duct without cholangitis or cholecystitis without obstruction: Secondary | ICD-10-CM | POA: Diagnosis not present

## 2015-06-14 DIAGNOSIS — J189 Pneumonia, unspecified organism: Secondary | ICD-10-CM | POA: Diagnosis not present

## 2015-06-15 DIAGNOSIS — J90 Pleural effusion, not elsewhere classified: Secondary | ICD-10-CM | POA: Diagnosis not present

## 2015-06-15 DIAGNOSIS — Z01810 Encounter for preprocedural cardiovascular examination: Secondary | ICD-10-CM | POA: Diagnosis not present

## 2015-06-15 DIAGNOSIS — K8050 Calculus of bile duct without cholangitis or cholecystitis without obstruction: Secondary | ICD-10-CM | POA: Diagnosis not present

## 2015-06-15 DIAGNOSIS — J40 Bronchitis, not specified as acute or chronic: Secondary | ICD-10-CM | POA: Diagnosis not present

## 2015-06-15 DIAGNOSIS — K830 Cholangitis: Secondary | ICD-10-CM | POA: Diagnosis not present

## 2015-06-15 DIAGNOSIS — K819 Cholecystitis, unspecified: Secondary | ICD-10-CM | POA: Diagnosis not present

## 2015-06-15 DIAGNOSIS — R410 Disorientation, unspecified: Secondary | ICD-10-CM | POA: Diagnosis not present

## 2015-06-15 DIAGNOSIS — J189 Pneumonia, unspecified organism: Secondary | ICD-10-CM | POA: Diagnosis not present

## 2015-06-15 DIAGNOSIS — R17 Unspecified jaundice: Secondary | ICD-10-CM | POA: Diagnosis not present

## 2015-06-15 DIAGNOSIS — B179 Acute viral hepatitis, unspecified: Secondary | ICD-10-CM | POA: Diagnosis not present

## 2015-06-22 ENCOUNTER — Other Ambulatory Visit: Payer: Self-pay | Admitting: Cardiovascular Disease

## 2015-07-02 DIAGNOSIS — Z9049 Acquired absence of other specified parts of digestive tract: Secondary | ICD-10-CM | POA: Diagnosis not present

## 2015-07-13 DIAGNOSIS — Z9049 Acquired absence of other specified parts of digestive tract: Secondary | ICD-10-CM | POA: Diagnosis not present

## 2015-07-17 ENCOUNTER — Other Ambulatory Visit: Payer: Self-pay | Admitting: Cardiovascular Disease

## 2015-07-22 ENCOUNTER — Telehealth: Payer: Self-pay

## 2015-07-22 NOTE — Telephone Encounter (Signed)
Patient moved to [place].

## 2015-07-22 NOTE — Telephone Encounter (Signed)
-----   Message from Elsie Saas sent at 07/22/2015 10:02 AM EDT ----- Spoke with patient daughter, she states that pt is now living with her and she [place] in [place] and that all his doctor's apt will be up there with her.  ----- Message -----    From: Festus Aloe, CMA    Sent: 07/19/2015   9:24 AM      To: Elsie Saas  Please schedule this patient a past due appointment with Dr.Gollan.  Thanks, [employer]

## 2015-07-26 DIAGNOSIS — N401 Enlarged prostate with lower urinary tract symptoms: Secondary | ICD-10-CM | POA: Diagnosis not present

## 2015-07-26 DIAGNOSIS — R5381 Other malaise: Secondary | ICD-10-CM | POA: Diagnosis not present

## 2015-07-26 DIAGNOSIS — Z86718 Personal history of other venous thrombosis and embolism: Secondary | ICD-10-CM | POA: Diagnosis not present

## 2015-07-26 DIAGNOSIS — R000 Tachycardia, unspecified: Secondary | ICD-10-CM | POA: Diagnosis not present

## 2015-07-27 DIAGNOSIS — R5381 Other malaise: Secondary | ICD-10-CM | POA: Diagnosis not present

## 2015-07-30 DIAGNOSIS — G3184 Mild cognitive impairment, so stated: Secondary | ICD-10-CM | POA: Diagnosis not present

## 2015-07-30 DIAGNOSIS — R9082 White matter disease, unspecified: Secondary | ICD-10-CM | POA: Diagnosis not present

## 2015-08-18 DIAGNOSIS — F05 Delirium due to known physiological condition: Secondary | ICD-10-CM | POA: Diagnosis not present

## 2015-08-18 DIAGNOSIS — E871 Hypo-osmolality and hyponatremia: Secondary | ICD-10-CM | POA: Diagnosis not present

## 2015-08-18 DIAGNOSIS — Z8719 Personal history of other diseases of the digestive system: Secondary | ICD-10-CM | POA: Diagnosis not present

## 2015-08-18 DIAGNOSIS — R4189 Other symptoms and signs involving cognitive functions and awareness: Secondary | ICD-10-CM | POA: Diagnosis not present

## 2015-08-24 DIAGNOSIS — K819 Cholecystitis, unspecified: Secondary | ICD-10-CM | POA: Diagnosis not present

## 2015-08-24 DIAGNOSIS — T85590A Other mechanical complication of bile duct prosthesis, initial encounter: Secondary | ICD-10-CM | POA: Diagnosis not present

## 2015-08-24 DIAGNOSIS — K830 Cholangitis: Secondary | ICD-10-CM | POA: Diagnosis not present

## 2015-08-24 DIAGNOSIS — K8030 Calculus of bile duct with cholangitis, unspecified, without obstruction: Secondary | ICD-10-CM | POA: Diagnosis not present

## 2015-08-24 DIAGNOSIS — Z9049 Acquired absence of other specified parts of digestive tract: Secondary | ICD-10-CM | POA: Diagnosis not present

## 2015-08-24 DIAGNOSIS — K8050 Calculus of bile duct without cholangitis or cholecystitis without obstruction: Secondary | ICD-10-CM | POA: Diagnosis not present

## 2015-08-24 DIAGNOSIS — Z4659 Encounter for fitting and adjustment of other gastrointestinal appliance and device: Secondary | ICD-10-CM | POA: Diagnosis not present

## 2015-08-24 DIAGNOSIS — T8585XA Stenosis due to internal prosthetic devices, implants and grafts, not elsewhere classified, initial encounter: Secondary | ICD-10-CM | POA: Diagnosis not present

## 2015-10-13 DIAGNOSIS — R4189 Other symptoms and signs involving cognitive functions and awareness: Secondary | ICD-10-CM | POA: Diagnosis not present

## 2015-10-13 DIAGNOSIS — I951 Orthostatic hypotension: Secondary | ICD-10-CM | POA: Diagnosis not present

## 2015-10-13 DIAGNOSIS — R54 Age-related physical debility: Secondary | ICD-10-CM | POA: Diagnosis not present

## 2015-10-13 DIAGNOSIS — Z23 Encounter for immunization: Secondary | ICD-10-CM | POA: Diagnosis not present

## 2015-10-13 DIAGNOSIS — N3941 Urge incontinence: Secondary | ICD-10-CM | POA: Diagnosis not present

## 2015-10-13 DIAGNOSIS — D6851 Activated protein C resistance: Secondary | ICD-10-CM | POA: Diagnosis not present

## 2015-10-25 DIAGNOSIS — J189 Pneumonia, unspecified organism: Secondary | ICD-10-CM | POA: Diagnosis not present

## 2016-01-11 IMAGING — CT CT CERVICAL SPINE WITHOUT CONTRAST
3 of 5 series · 13 of 33 positions shown, 16 images · non-contrast
Comparison: None.

CLINICAL DATA: Fall.

EXAM:
CT HEAD WITHOUT CONTRAST
CT CERVICAL SPINE WITHOUT CONTRAST
TECHNIQUE: Multidetector CT imaging of the head and cervical spine was
performed following the standard protocol without intravenous
contrast. Multiplanar CT image reconstructions of the cervical spine
were also generated.

[Series 8: sag bone · sagittal · 0.30mm/px · 5 of 91 slices shown, 6 images]
[im 31/91  bone]
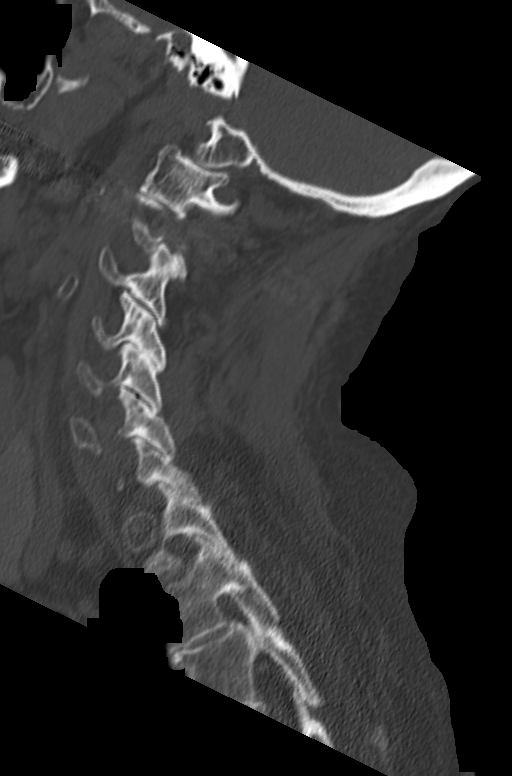
[im 38/91  bone]
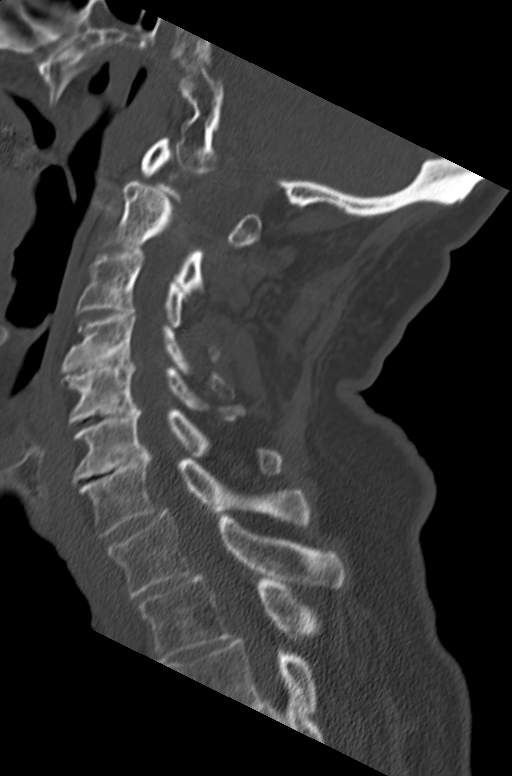
[im 46/91  soft-tissue]
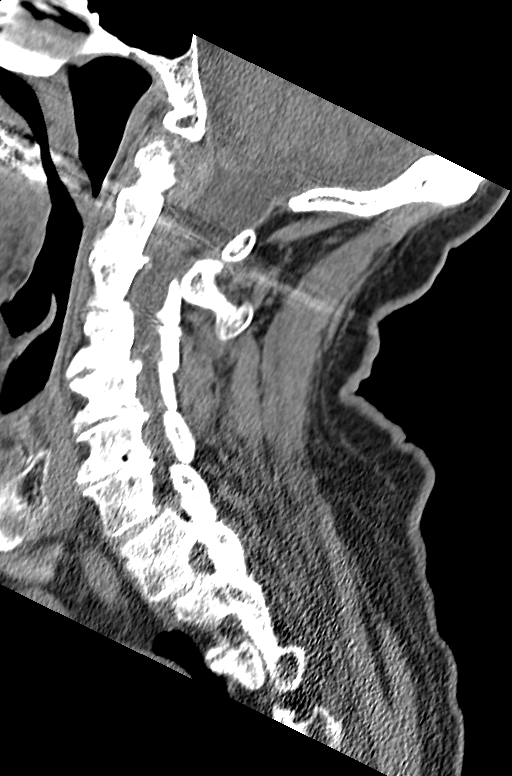
[im 46/91  bone]
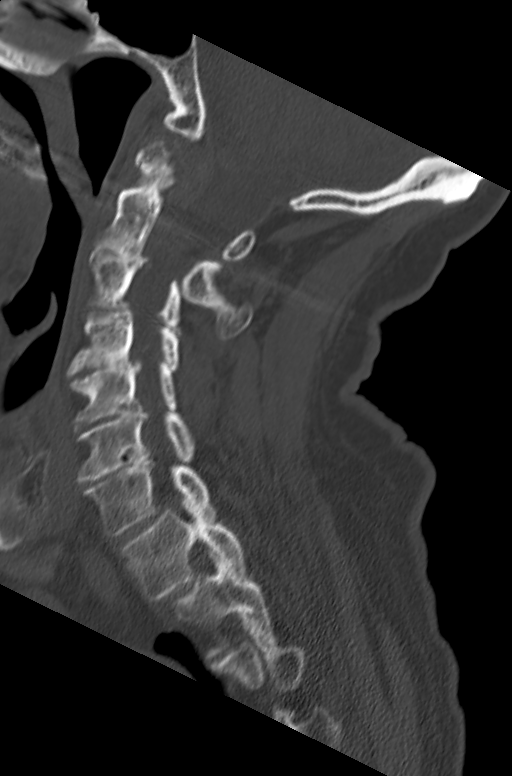
[im 53/91  bone]
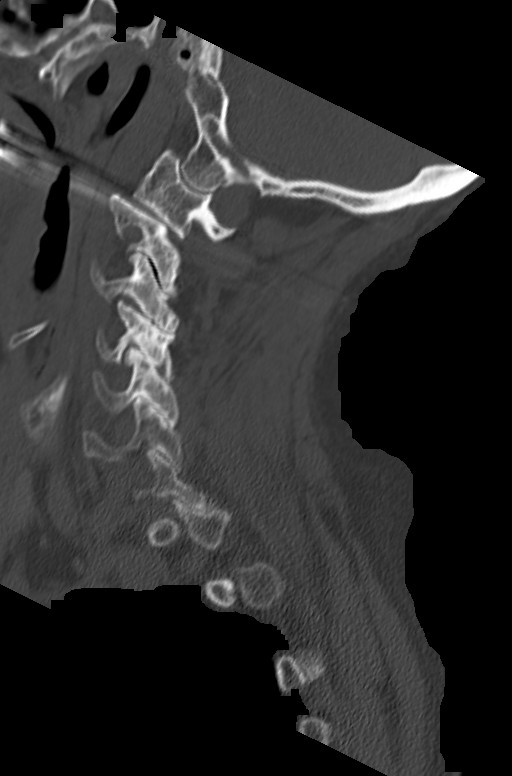
[im 61/91  bone]
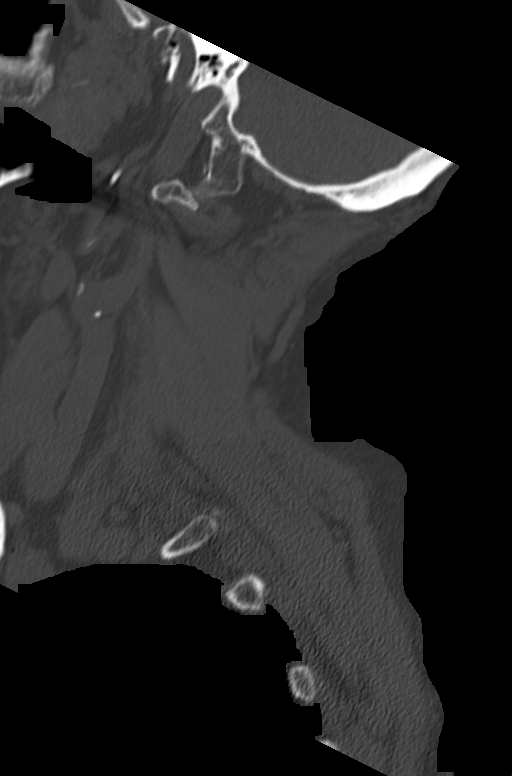

[Series 9: cor bone · coronal · 0.41mm/px · 3 of 68 slices shown]
[im 15/68  bone]
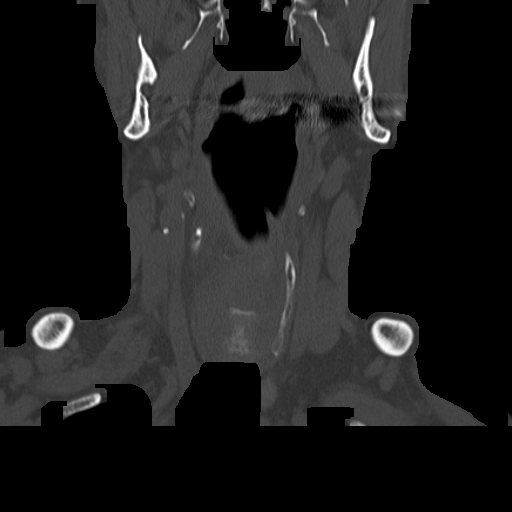
[im 28/68  bone]
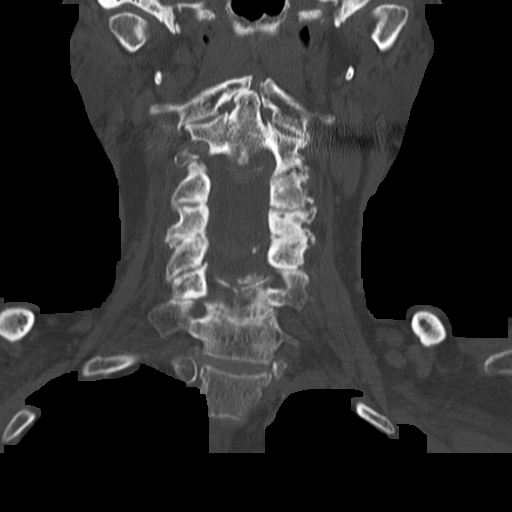
[im 40/68  bone]
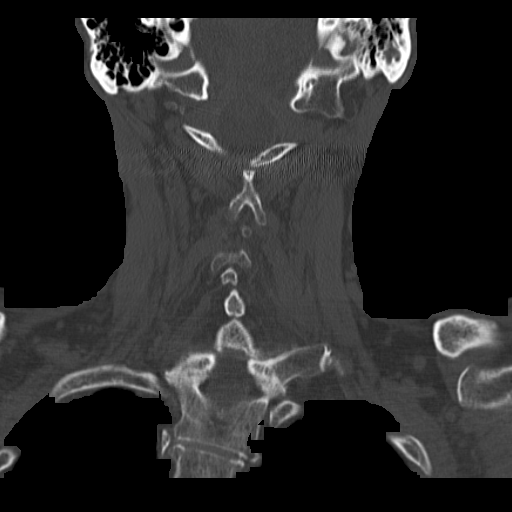

[Series 10: orthogonal axials · axial · 0.29mm/px · z∈[-366,-234]mm · 5 of 110 slices shown, 7 images]
[im 19/110  soft-tissue]
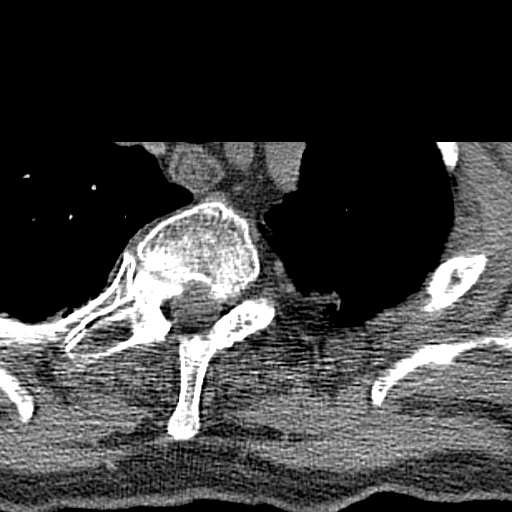
[im 19/110  bone]
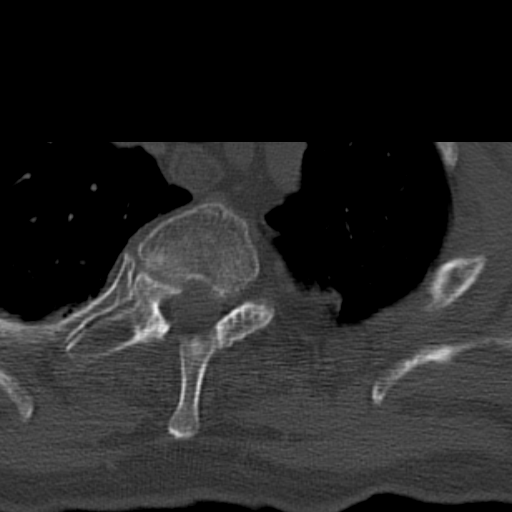
[im 37/110  bone]
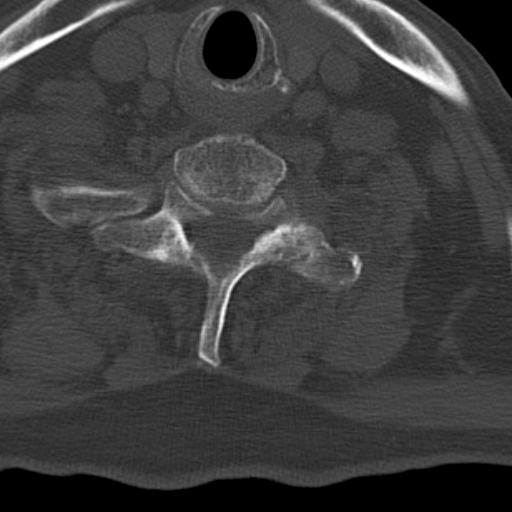
[im 55/110  bone]
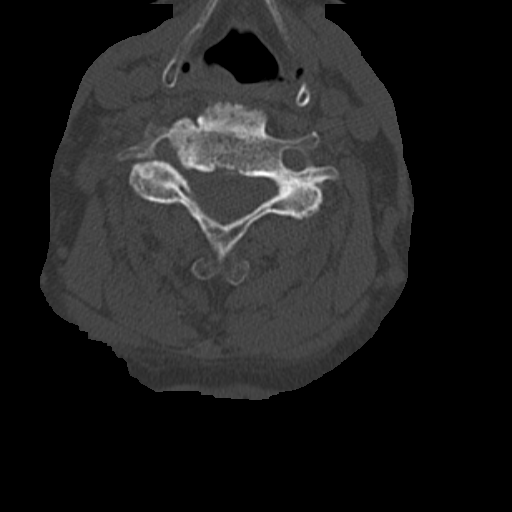
[im 73/110  bone]
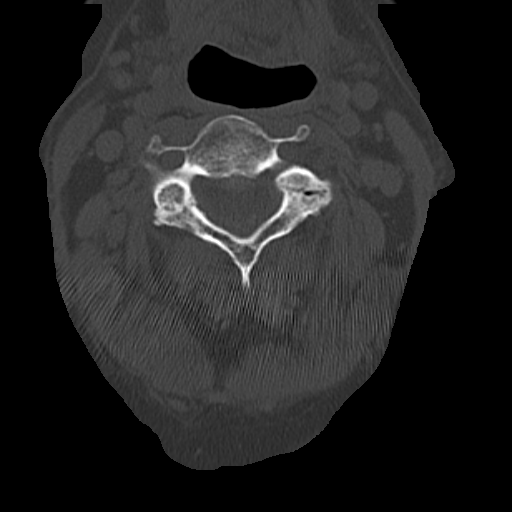
[im 91/110  soft-tissue]
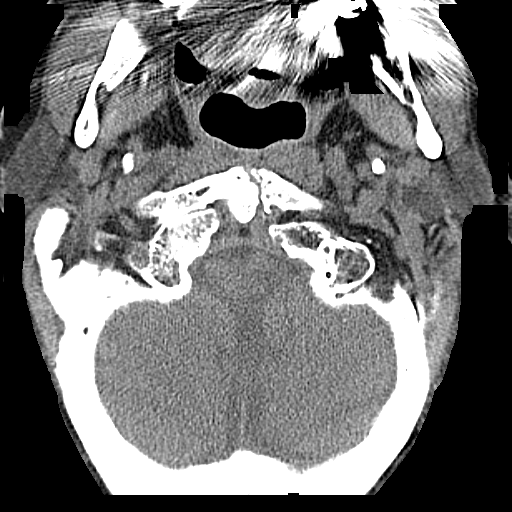
[im 91/110  bone]
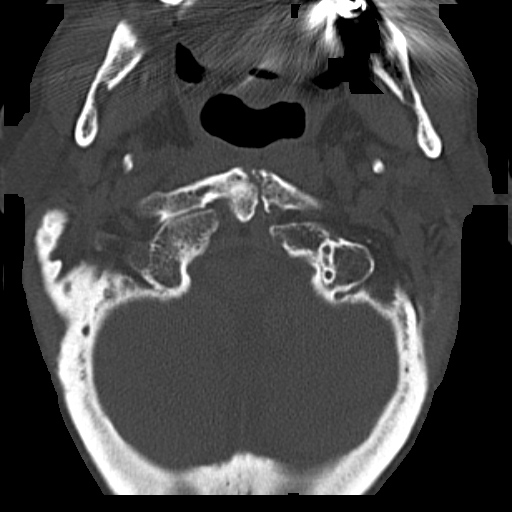

[13 of 33 positions shown; findings below may reference images not displayed]

FINDINGS: CT HEAD FINDINGS

Moderate atrophy.  Moderate chronic microvascular ischemic changes.

Negative for acute infarct. Negative for hemorrhage or mass.
Calvarium intact.

CT CERVICAL SPINE FINDINGS

Mild anterior slip C3-4 and C7-T1 felt to be degenerative.

Moderate to advanced disc degeneration and spondylosis throughout
the cervical spine most severe at C4-5, C5-6, C6-7. Diffuse facet
degeneration.

Negative for fracture in the cervical spine
IMPRESSION: Atrophy and chronic microvascular ischemia. No acute intracranial
abnormality.

Advanced cervical degenerative change without fracture.

## 2016-01-11 IMAGING — CR DG CHEST 1V
1 series · 1 of 1 positions shown · non-contrast
Comparison: Portable chest today

CLINICAL DATA: Altered mental status

EXAM:
CHEST - 1 VIEW

[w chest lat]
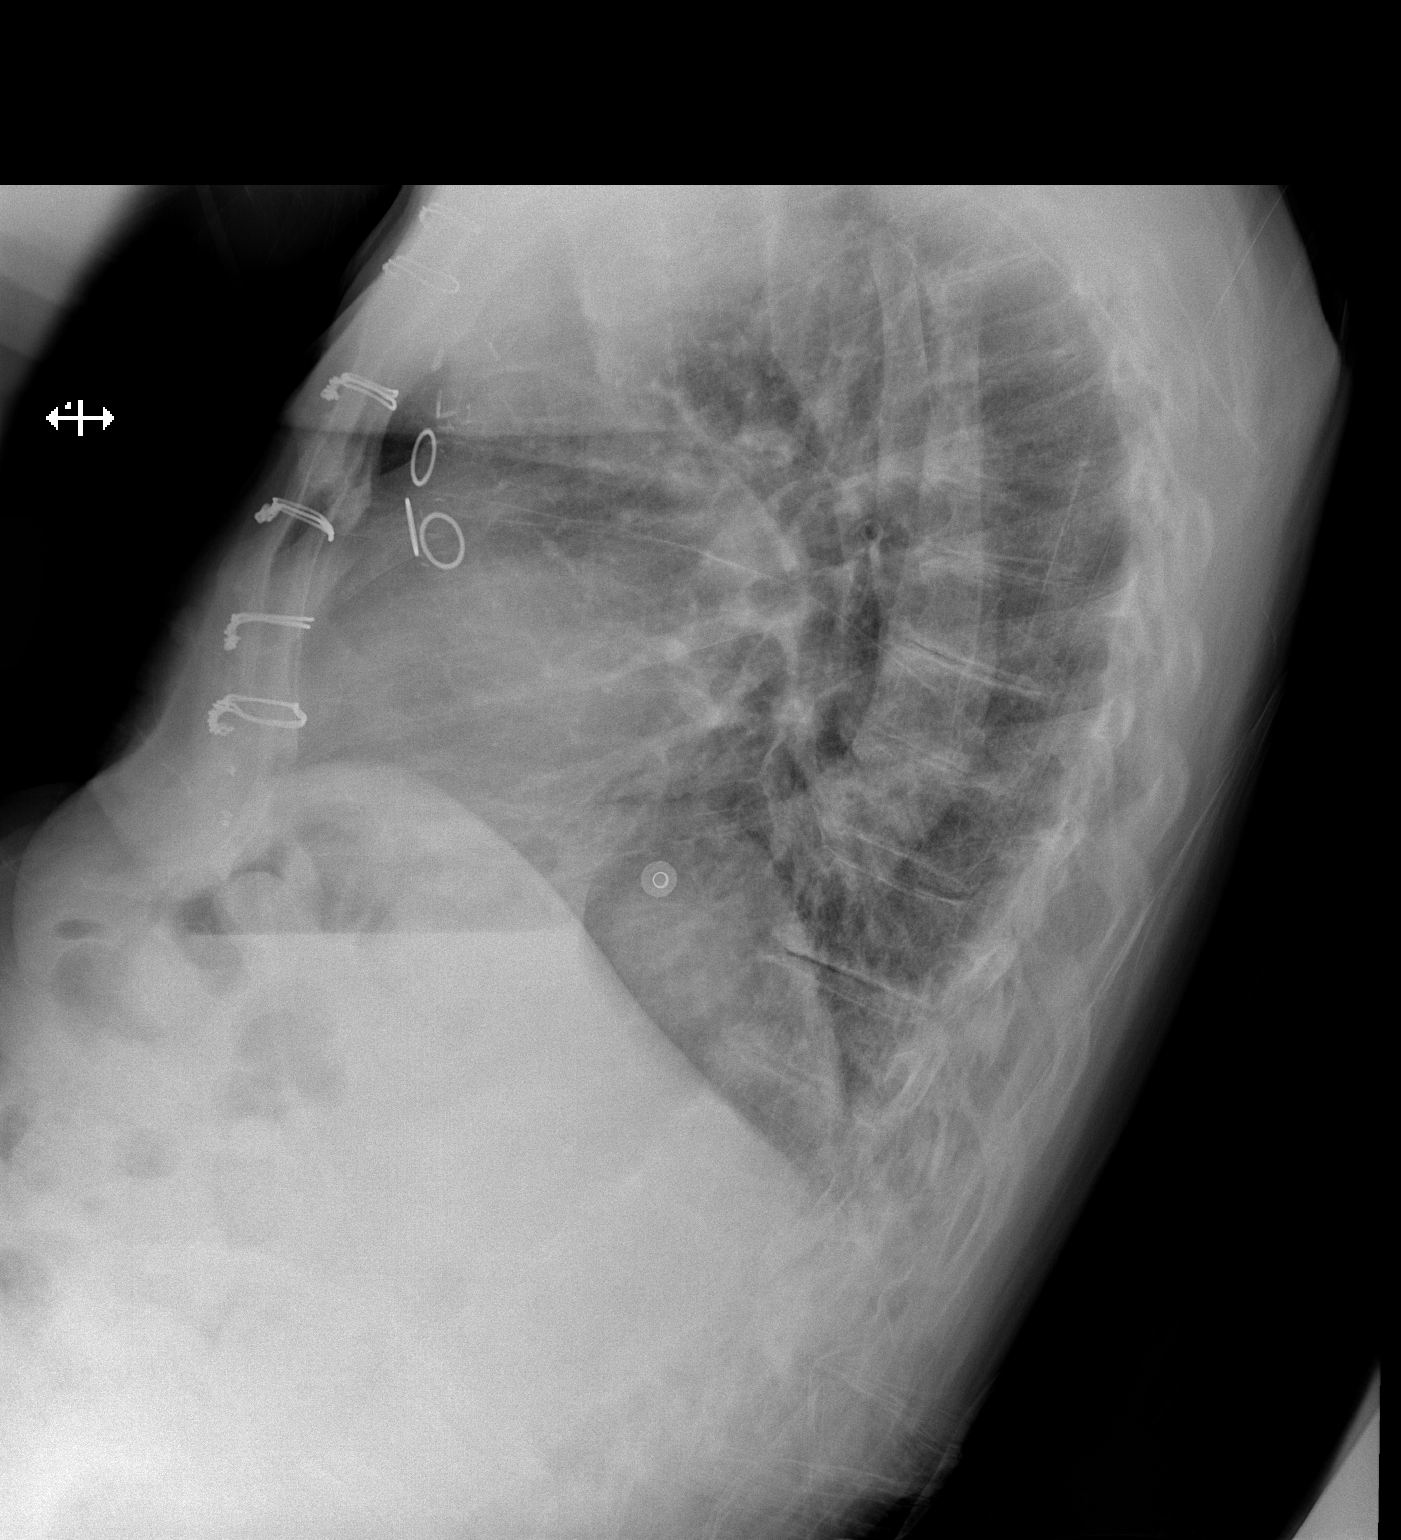

[1 of 1 positions shown; findings below may reference images not displayed]

FINDINGS: Prior CABG. Mild atelectasis in the bases. No infiltrate or
effusion.
IMPRESSION: Mild bibasilar atelectasis.

## 2016-02-02 DIAGNOSIS — R4189 Other symptoms and signs involving cognitive functions and awareness: Secondary | ICD-10-CM | POA: Diagnosis not present

## 2016-02-02 DIAGNOSIS — N3941 Urge incontinence: Secondary | ICD-10-CM | POA: Diagnosis not present

## 2016-02-02 DIAGNOSIS — D6851 Activated protein C resistance: Secondary | ICD-10-CM | POA: Diagnosis not present

## 2016-02-02 DIAGNOSIS — R54 Age-related physical debility: Secondary | ICD-10-CM | POA: Diagnosis not present

## 2016-02-02 DIAGNOSIS — R627 Adult failure to thrive: Secondary | ICD-10-CM | POA: Diagnosis not present

## 2016-02-02 DIAGNOSIS — I951 Orthostatic hypotension: Secondary | ICD-10-CM | POA: Diagnosis not present

## 2016-02-02 DIAGNOSIS — R3915 Urgency of urination: Secondary | ICD-10-CM | POA: Diagnosis not present

## 2016-03-07 DIAGNOSIS — Z86718 Personal history of other venous thrombosis and embolism: Secondary | ICD-10-CM | POA: Diagnosis not present

## 2016-03-07 DIAGNOSIS — I2583 Coronary atherosclerosis due to lipid rich plaque: Secondary | ICD-10-CM | POA: Diagnosis not present

## 2016-03-07 DIAGNOSIS — R4189 Other symptoms and signs involving cognitive functions and awareness: Secondary | ICD-10-CM | POA: Diagnosis not present

## 2016-03-07 DIAGNOSIS — I2510 Atherosclerotic heart disease of native coronary artery without angina pectoris: Secondary | ICD-10-CM | POA: Diagnosis not present

## 2016-03-07 DIAGNOSIS — N183 Chronic kidney disease, stage 3 (moderate): Secondary | ICD-10-CM | POA: Diagnosis not present

## 2016-03-07 DIAGNOSIS — R627 Adult failure to thrive: Secondary | ICD-10-CM | POA: Diagnosis not present

## 2016-03-22 DIAGNOSIS — R627 Adult failure to thrive: Secondary | ICD-10-CM | POA: Diagnosis not present

## 2016-03-30 DIAGNOSIS — R3915 Urgency of urination: Secondary | ICD-10-CM | POA: Diagnosis not present

## 2016-05-16 DIAGNOSIS — I2510 Atherosclerotic heart disease of native coronary artery without angina pectoris: Secondary | ICD-10-CM | POA: Diagnosis not present

## 2016-05-16 DIAGNOSIS — I2583 Coronary atherosclerosis due to lipid rich plaque: Secondary | ICD-10-CM | POA: Diagnosis not present

## 2016-05-16 DIAGNOSIS — N183 Chronic kidney disease, stage 3 (moderate): Secondary | ICD-10-CM | POA: Diagnosis not present

## 2016-05-16 DIAGNOSIS — R627 Adult failure to thrive: Secondary | ICD-10-CM | POA: Diagnosis not present

## 2016-10-17 DIAGNOSIS — F0150 Vascular dementia without behavioral disturbance: Secondary | ICD-10-CM | POA: Diagnosis not present

## 2016-10-17 DIAGNOSIS — I2510 Atherosclerotic heart disease of native coronary artery without angina pectoris: Secondary | ICD-10-CM | POA: Diagnosis not present

## 2016-10-17 DIAGNOSIS — I509 Heart failure, unspecified: Secondary | ICD-10-CM | POA: Diagnosis not present

## 2017-01-03 DIAGNOSIS — F0150 Vascular dementia without behavioral disturbance: Secondary | ICD-10-CM | POA: Diagnosis not present

## 2017-01-03 DIAGNOSIS — I951 Orthostatic hypotension: Secondary | ICD-10-CM | POA: Diagnosis not present

## 2017-01-03 DIAGNOSIS — I2510 Atherosclerotic heart disease of native coronary artery without angina pectoris: Secondary | ICD-10-CM | POA: Diagnosis not present

## 2017-01-03 DIAGNOSIS — N3941 Urge incontinence: Secondary | ICD-10-CM | POA: Diagnosis not present

## 2017-04-12 DIAGNOSIS — I2583 Coronary atherosclerosis due to lipid rich plaque: Secondary | ICD-10-CM | POA: Diagnosis not present

## 2017-04-12 DIAGNOSIS — F0390 Unspecified dementia without behavioral disturbance: Secondary | ICD-10-CM | POA: Diagnosis not present

## 2017-04-12 DIAGNOSIS — R627 Adult failure to thrive: Secondary | ICD-10-CM | POA: Diagnosis not present

## 2017-04-12 DIAGNOSIS — N183 Chronic kidney disease, stage 3 (moderate): Secondary | ICD-10-CM | POA: Diagnosis not present

## 2017-04-12 DIAGNOSIS — I2510 Atherosclerotic heart disease of native coronary artery without angina pectoris: Secondary | ICD-10-CM | POA: Diagnosis not present

## 2017-10-03 DIAGNOSIS — J690 Pneumonitis due to inhalation of food and vomit: Secondary | ICD-10-CM | POA: Diagnosis not present

## 2017-10-03 DIAGNOSIS — Z951 Presence of aortocoronary bypass graft: Secondary | ICD-10-CM | POA: Diagnosis not present

## 2017-10-03 DIAGNOSIS — D6851 Activated protein C resistance: Secondary | ICD-10-CM | POA: Diagnosis not present

## 2017-10-03 DIAGNOSIS — Z66 Do not resuscitate: Secondary | ICD-10-CM | POA: Diagnosis not present

## 2017-10-03 DIAGNOSIS — R4182 Altered mental status, unspecified: Secondary | ICD-10-CM | POA: Diagnosis not present

## 2017-10-03 DIAGNOSIS — J0100 Acute maxillary sinusitis, unspecified: Secondary | ICD-10-CM | POA: Diagnosis not present

## 2017-10-03 DIAGNOSIS — R400 Somnolence: Secondary | ICD-10-CM | POA: Diagnosis not present

## 2017-10-03 DIAGNOSIS — Z86718 Personal history of other venous thrombosis and embolism: Secondary | ICD-10-CM | POA: Diagnosis not present

## 2017-10-03 DIAGNOSIS — I4510 Unspecified right bundle-branch block: Secondary | ICD-10-CM | POA: Diagnosis not present

## 2017-10-03 DIAGNOSIS — F0390 Unspecified dementia without behavioral disturbance: Secondary | ICD-10-CM | POA: Diagnosis not present

## 2017-10-03 DIAGNOSIS — F05 Delirium due to known physiological condition: Secondary | ICD-10-CM | POA: Diagnosis not present

## 2017-11-13 DIAGNOSIS — R54 Age-related physical debility: Secondary | ICD-10-CM | POA: Diagnosis not present

## 2017-11-13 DIAGNOSIS — J690 Pneumonitis due to inhalation of food and vomit: Secondary | ICD-10-CM | POA: Diagnosis not present

## 2017-11-13 DIAGNOSIS — F0150 Vascular dementia without behavioral disturbance: Secondary | ICD-10-CM | POA: Diagnosis not present

## 2018-02-15 DEATH — deceased
